# Patient Record
Sex: Male | Born: 1955 | Race: White | Hispanic: No | Marital: Single | Smoking: Former smoker
Health system: Southern US, Community
[De-identification: ages and names within clinical notes are randomized; demographics above are authoritative.]

## PROBLEM LIST (undated history)

## (undated) DIAGNOSIS — F17200 Nicotine dependence, unspecified, uncomplicated: Secondary | ICD-10-CM

## (undated) DIAGNOSIS — R0602 Shortness of breath: Secondary | ICD-10-CM

## (undated) DIAGNOSIS — K635 Polyp of colon: Secondary | ICD-10-CM

## (undated) DIAGNOSIS — I509 Heart failure, unspecified: Secondary | ICD-10-CM

## (undated) DIAGNOSIS — M1710 Unilateral primary osteoarthritis, unspecified knee: Secondary | ICD-10-CM

## (undated) DIAGNOSIS — J449 Chronic obstructive pulmonary disease, unspecified: Secondary | ICD-10-CM

## (undated) DIAGNOSIS — G4733 Obstructive sleep apnea (adult) (pediatric): Secondary | ICD-10-CM

## (undated) DIAGNOSIS — R600 Localized edema: Secondary | ICD-10-CM

## (undated) DIAGNOSIS — E669 Obesity, unspecified: Secondary | ICD-10-CM

## (undated) DIAGNOSIS — I10 Essential (primary) hypertension: Secondary | ICD-10-CM

## (undated) DIAGNOSIS — I272 Pulmonary hypertension, unspecified: Secondary | ICD-10-CM

## (undated) DIAGNOSIS — M179 Osteoarthritis of knee, unspecified: Secondary | ICD-10-CM

## (undated) DIAGNOSIS — R011 Cardiac murmur, unspecified: Secondary | ICD-10-CM

## (undated) DIAGNOSIS — E782 Mixed hyperlipidemia: Secondary | ICD-10-CM

## (undated) DIAGNOSIS — I349 Nonrheumatic mitral valve disorder, unspecified: Secondary | ICD-10-CM

## (undated) DIAGNOSIS — M1A9XX0 Chronic gout, unspecified, without tophus (tophi): Secondary | ICD-10-CM

## (undated) DIAGNOSIS — H409 Unspecified glaucoma: Secondary | ICD-10-CM

## (undated) HISTORY — DX: Chronic obstructive pulmonary disease, unspecified: J44.9

## (undated) HISTORY — PX: REPLACEMENT TOTAL KNEE: SUR1224

## (undated) HISTORY — PX: CERVICAL FUSION: SHX112

## (undated) SURGERY — LEFT HEART CATH AND CORONARY ANGIOGRAPHY
Anesthesia: Moderate Sedation

---

## 2006-06-01 ENCOUNTER — Ambulatory Visit: Payer: Self-pay | Admitting: Gastroenterology

## 2009-11-21 ENCOUNTER — Ambulatory Visit: Payer: Self-pay | Admitting: Family Medicine

## 2010-01-01 ENCOUNTER — Ambulatory Visit (HOSPITAL_COMMUNITY): Admission: RE | Admit: 2010-01-01 | Discharge: 2010-01-02 | Payer: Self-pay | Admitting: Neurological Surgery

## 2010-02-02 ENCOUNTER — Encounter: Admission: RE | Admit: 2010-02-02 | Discharge: 2010-02-02 | Payer: Self-pay | Admitting: Neurological Surgery

## 2010-06-11 LAB — CBC
Hemoglobin: 14.2 g/dL (ref 13.0–17.0)
RBC: 4.37 MIL/uL (ref 4.22–5.81)
WBC: 10.4 10*3/uL (ref 4.0–10.5)

## 2010-06-11 LAB — COMPREHENSIVE METABOLIC PANEL
ALT: 28 U/L (ref 0–53)
AST: 29 U/L (ref 0–37)
Alkaline Phosphatase: 67 U/L (ref 39–117)
CO2: 33 mEq/L — ABNORMAL HIGH (ref 19–32)
Calcium: 9.2 mg/dL (ref 8.4–10.5)
Chloride: 97 mEq/L (ref 96–112)
GFR calc Af Amer: >60 mL/min (ref >60)
GFR calc non Af Amer: >60 mL/min (ref >60)
Potassium: 3.8 mEq/L (ref 3.5–5.1)
Sodium: 136 mEq/L (ref 135–145)
Total Bilirubin: 0.6 mg/dL (ref 0.3–1.2)

## 2010-06-11 LAB — DIFFERENTIAL
Eosinophils Absolute: 0.2 10*3/uL (ref 0.0–0.7)
Eosinophils Relative: 1 % (ref 0–5)
Lymphs Abs: 2 10*3/uL (ref 0.7–4.0)

## 2010-06-11 LAB — SURGICAL PCR SCREEN: MRSA, PCR: NEGATIVE

## 2010-06-11 LAB — PROTIME-INR: Prothrombin Time: 12.3 seconds (ref 11.6–15.2)

## 2013-10-04 ENCOUNTER — Emergency Department: Payer: Self-pay | Admitting: Emergency Medicine

## 2014-12-28 ENCOUNTER — Encounter: Payer: Self-pay | Admitting: Emergency Medicine

## 2014-12-28 ENCOUNTER — Emergency Department: Payer: BLUE CROSS/BLUE SHIELD

## 2014-12-28 ENCOUNTER — Emergency Department
Admission: EM | Admit: 2014-12-28 | Discharge: 2014-12-28 | Disposition: A | Discharge status: Home / Self Care | Payer: BLUE CROSS/BLUE SHIELD | Attending: Emergency Medicine | Admitting: Emergency Medicine

## 2014-12-28 DIAGNOSIS — Z7982 Long term (current) use of aspirin: Secondary | ICD-10-CM | POA: Diagnosis not present

## 2014-12-28 DIAGNOSIS — Z79899 Other long term (current) drug therapy: Secondary | ICD-10-CM | POA: Insufficient documentation

## 2014-12-28 DIAGNOSIS — M545 Low back pain, unspecified: Secondary | ICD-10-CM

## 2014-12-28 DIAGNOSIS — R609 Edema, unspecified: Secondary | ICD-10-CM | POA: Diagnosis not present

## 2014-12-28 DIAGNOSIS — Z791 Long term (current) use of non-steroidal anti-inflammatories (NSAID): Secondary | ICD-10-CM | POA: Insufficient documentation

## 2014-12-28 DIAGNOSIS — I10 Essential (primary) hypertension: Secondary | ICD-10-CM | POA: Diagnosis not present

## 2014-12-28 HISTORY — DX: Polyp of colon: K63.5

## 2014-12-28 HISTORY — DX: Nonrheumatic mitral valve disorder, unspecified: I34.9

## 2014-12-28 HISTORY — DX: Mixed hyperlipidemia: E78.2

## 2014-12-28 HISTORY — DX: Essential (primary) hypertension: I10

## 2014-12-28 HISTORY — DX: Chronic gout, unspecified, without tophus (tophi): M1A.9XX0

## 2014-12-28 HISTORY — DX: Unilateral primary osteoarthritis, unspecified knee: M17.10

## 2014-12-28 HISTORY — DX: Osteoarthritis of knee, unspecified: M17.9

## 2014-12-28 LAB — COMPREHENSIVE METABOLIC PANEL
ALT: 22 U/L (ref 17–63)
AST: 26 U/L (ref 15–41)
Albumin: 3.5 g/dL (ref 3.5–5.0)
Alkaline Phosphatase: 76 U/L (ref 38–126)
Anion gap: 6 (ref 5–15)
BUN: 17 mg/dL (ref 6–20)
CHLORIDE: 103 mmol/L (ref 101–111)
CO2: 31 mmol/L (ref 22–32)
Calcium: 8.5 mg/dL — ABNORMAL LOW (ref 8.9–10.3)
Creatinine, Ser: 0.79 mg/dL (ref 0.61–1.24)
Glucose, Bld: 119 mg/dL — ABNORMAL HIGH (ref 65–99)
POTASSIUM: 4.1 mmol/L (ref 3.5–5.1)
Sodium: 140 mmol/L (ref 135–145)
Total Bilirubin: 0.6 mg/dL (ref 0.3–1.2)
Total Protein: 6.8 g/dL (ref 6.5–8.1)

## 2014-12-28 LAB — URINALYSIS COMPLETE WITH MICROSCOPIC (ARMC ONLY)
BILIRUBIN URINE: NEGATIVE
Bacteria, UA: NONE SEEN
GLUCOSE, UA: NEGATIVE mg/dL
HGB URINE DIPSTICK: NEGATIVE
Ketones, ur: NEGATIVE mg/dL
LEUKOCYTES UA: NEGATIVE
Nitrite: NEGATIVE
Protein, ur: NEGATIVE mg/dL
SPECIFIC GRAVITY, URINE: 1.02 (ref 1.005–1.030)
pH: 5 (ref 5.0–8.0)

## 2014-12-28 LAB — CBC
HCT: 43.2 % (ref 40.0–52.0)
Hemoglobin: 13.9 g/dL (ref 13.0–18.0)
MCH: 31.9 pg (ref 26.0–34.0)
MCHC: 32.2 g/dL (ref 32.0–36.0)
MCV: 99 fL (ref 80.0–100.0)
PLATELETS: 211 10*3/uL (ref 150–440)
RBC: 4.36 MIL/uL — ABNORMAL LOW (ref 4.40–5.90)
RDW: 14.1 % (ref 11.5–14.5)
WBC: 8.8 10*3/uL (ref 3.8–10.6)

## 2014-12-28 MED ORDER — MORPHINE SULFATE (PF) 4 MG/ML IV SOLN
4.0000 mg | Freq: Once | INTRAVENOUS | Status: AC
  Administered 2014-12-28: 4 mg via INTRAVENOUS

## 2014-12-28 MED ORDER — MORPHINE SULFATE (PF) 4 MG/ML IV SOLN: 4.0000 mg | Freq: Once | INTRAVENOUS | Status: DC

## 2014-12-28 MED ORDER — ONDANSETRON HCL 4 MG/2ML IJ SOLN: 4.0000 mg | Freq: Once | INTRAMUSCULAR | Status: DC

## 2014-12-28 MED ORDER — OXYCODONE-ACETAMINOPHEN 7.5-325 MG PO TABS: 1.0000 | ORAL_TABLET | ORAL | Status: DC | PRN

## 2014-12-28 MED ORDER — MORPHINE SULFATE (PF) 4 MG/ML IV SOLN
INTRAVENOUS | Status: AC
  Administered 2014-12-28: 4 mg via INTRAVENOUS
  Filled 2014-12-28: qty 1

## 2014-12-28 MED ORDER — FUROSEMIDE 20 MG PO TABS: 20.0000 mg | ORAL_TABLET | Freq: Every day | ORAL | Status: DC

## 2014-12-28 MED ORDER — IOHEXOL 350 MG/ML SOLN
100.0000 mL | Freq: Once | INTRAVENOUS | Status: AC | PRN
  Administered 2014-12-28: 100 mL via INTRAVENOUS

## 2014-12-28 MED ORDER — ONDANSETRON HCL 4 MG/2ML IJ SOLN
4.0000 mg | Freq: Once | INTRAMUSCULAR | Status: AC
  Administered 2014-12-28: 4 mg via INTRAVENOUS

## 2014-12-28 MED ORDER — ONDANSETRON HCL 4 MG/2ML IJ SOLN
INTRAMUSCULAR | Status: AC
  Administered 2014-12-28: 4 mg via INTRAVENOUS
  Filled 2014-12-28: qty 2

## 2014-12-28 MED ORDER — CYCLOBENZAPRINE HCL 10 MG PO TABS: 10.0000 mg | ORAL_TABLET | Freq: Three times a day (TID) | ORAL | Status: DC | PRN

## 2014-12-28 NOTE — ED Provider Notes (Signed)
  IMPRESSION: Areas of atherosclerotic change without aneurysm or dissection. No periaortic fluid. No hemodynamically significant obstruction noted in the major abdominal or pelvic vessels.  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  Left adrenal adenoma.  No renal or ureteral calculus. No hydronephrosis. There are a few prostatic calcifications.  Appendix appears normal. No bowel obstruction. No evidence of bowel ischemia.   Patient's pain is improved this point. He'll receive an additional dose of IV morphine and be discharged with pain medicine and muscle relaxant. I will also put him on a short supply of Lasix as he has lower extremity edema.  Emily Filbert, MD 12/28/14 [phone]

## 2014-12-28 NOTE — ED Provider Notes (Signed)
[facility] Emergency Department Provider Note  ____________________________________________  Time seen: Approximately 542 AM  I have reviewed the triage vital signs and the nursing notes.   HISTORY  Chief Complaint Back Pain    HPI Aaron Doyle is a 59 y.o. male who comes in today with bad lower back pain. The patient reports that the pain started the beginning of the week. The patient reports that the pain has been getting worse and worse during the week. He has no known injury and reports that he just woke up one morning hurting. The patient has been taking ibuprofen but went to his doctor this morning was given hydrocodone. He reports that he was told to take it easy and come back in a week to see if the pain was improving but he reports that the pain continued to worsen and he could not tolerate the pain anymore. The patient reports that the pain does not shoot down his leg but he feels as though it buckles whenever he walks. He reports that the pain is on the left side of his back and is 8 out of 10 in intensity. The patient's number had pain like this in the past and does not have a history of chronic back pain. The patient denies any abdominal pain any chest pain and shortness of breath blurred vision dizziness nausea or vomiting. He also denies any hematuria.   Past Medical History  Diagnosis Date  . Mixed hyperlipidemia   . Hypertension   . Nonrheumatic mitral valve disorder   . Osteoarthritis of knee   . Chronic gout   . Colon polyp     There are no active problems to display for this patient.   History reviewed. No pertinent past surgical history.  Current Outpatient Rx  Name  Route  Sig  Dispense  Refill  . allopurinol (ZYLOPRIM) 300 MG tablet   Oral   Take 300 mg by mouth at bedtime.         Marland Kitchen amLODipine (NORVASC) 10 MG tablet   Oral   Take 10 mg by mouth daily.         Marland Kitchen aspirin 81 MG tablet   Oral   Take 81 mg by mouth  daily.         Marland Kitchen atorvastatin (LIPITOR) 40 MG tablet   Oral   Take 40 mg by mouth daily.         . benazepril (LOTENSIN) 40 MG tablet   Oral   Take 40 mg by mouth daily.         . naproxen (NAPROSYN) 250 MG tablet   Oral   Take 250 mg by mouth 2 (two) times daily with a meal.         . Travoprost, BAK Free, (TRAVATAN) 0.004 % SOLN ophthalmic solution   Both Eyes   Place 1 drop into both eyes at bedtime.           Allergies Review of patient's allergies indicates no known allergies.  No family history on file.  Social History Social History  Substance Use Topics  . Smoking status: Unknown If Ever Smoked  . Smokeless tobacco: None  . Alcohol Use: No    Review of Systems Constitutional: No fever/chills Eyes: No visual changes. ENT: No sore throat. Cardiovascular: Denies chest pain. Respiratory: Denies shortness of breath. Gastrointestinal: No abdominal pain.  No nausea, no vomiting.   Genitourinary: Negative for dysuria. Musculoskeletal: back pain. Skin: Negative for rash.  Neurological: Negative for headaches, focal weakness or numbness.  10-point ROS otherwise negative.  ____________________________________________   PHYSICAL EXAM:  VITAL SIGNS: ED Triage Vitals  Enc Vitals Group     BP 12/28/14 0223 131/75 mmHg     Pulse Rate 12/28/14 0223 72     Resp 12/28/14 0223 20     Temp 12/28/14 0223 97.7 F (36.5 C)     Temp Source 12/28/14 0223 Oral     SpO2 12/28/14 0223 98 %     Weight 12/28/14 0223 256 lb (116.121 kg)     Height 12/28/14 0223 6' (1.829 m)     Head Cir --      Peak Flow --      Pain Score 12/28/14 0224 9     Pain Loc --      Pain Edu? --      Excl. in GC? --     Constitutional: Alert and oriented. Well appearing and in moderate distress. Eyes: Conjunctivae are normal. PERRL. EOMI. Head: Atraumatic. Nose: No congestion/rhinnorhea. Mouth/Throat: Mucous membranes are moist.  Oropharynx non-erythematous. Cardiovascular:  Normal rate, regular rhythm. Grossly normal heart sounds.  Good peripheral circulation. Respiratory: Normal respiratory effort.  No retractions. Lungs CTAB. Gastrointestinal: Soft and nontender. No distention.  Musculoskeletal: Bilateral lower extremity pitting edema. Left sided back tenderness to palpation with negative straight leg raise Neurologic:  Normal speech and language.  Skin:  Skin is warm, dry and intact.  Psychiatric: Mood and affect are normal.   ____________________________________________   LABS (all labs ordered are listed, but only abnormal results are displayed)  Labs Reviewed  URINALYSIS COMPLETEWITH MICROSCOPIC ([facility] ONLY) - Abnormal; Notable for the following:    Color, Urine YELLOW (*)    APPearance CLEAR (*)    Squamous Epithelial / LPF 0-5 (*)    All other components within normal limits  CBC - Abnormal; Notable for the following:    RBC 4.36 (*)    All other components within normal limits  COMPREHENSIVE METABOLIC PANEL - Abnormal; Notable for the following:    Glucose, Bld 119 (*)    Calcium 8.5 (*)    All other components within normal limits   ____________________________________________  EKG  None ____________________________________________  RADIOLOGY  CTA abdomen and pelvis ____________________________________________   PROCEDURES  Procedure(s) performed: None  Critical Care performed: No  ____________________________________________   INITIAL IMPRESSION / ASSESSMENT AND PLAN / ED COURSE  Pertinent labs & imaging results that were available during my care of the patient were reviewed by me and considered in my medical decision making (see chart for details).  This is a 59 year old male who comes in with left-sided back pain for the past week. The patient does not have a history of back pain and reports that the pain has been worsening and unbearable. The patient's blood work is unremarkable but given the patient's age and the new  onset of back pain I feel the patient needs to be evaluated for a AAA. The patient did receive a CT of his abdomen as well as some morphine and Zofran for pain and nausea.  Patient's care was signed out to Dr. Daryel November will follow-up the results of the patient's CT scan and determine the patient's disposition. ____________________________________________   FINAL CLINICAL IMPRESSION(S) / ED DIAGNOSES  Final diagnoses:  Left-sided low back pain without sciatica      Rebecka Apley, MD 12/28/14 0840

## 2014-12-28 NOTE — ED Notes (Signed)
Patient reports left lower back pain since Monday.  Patient reports pain progressively worse over the week.  Reports seen by his primary MD on Friday and given pain pills, but no relief.

## 2014-12-28 NOTE — Discharge Instructions (Signed)
Back Pain, Adult °Low back pain is very common. About 1 in 5 people have back pain. The cause of low back pain is rarely dangerous. The pain often gets better over time. About half of people with a sudden onset of back pain feel better in just 2 weeks. About 8 in 10 people feel better by 6 weeks.  °CAUSES °Some common causes of back pain include: °· Strain of the muscles or ligaments supporting the spine. °· Wear and tear (degeneration) of the spinal discs. °· Arthritis. °· Direct injury to the back. °DIAGNOSIS °Most of the time, the direct cause of low back pain is not known. However, back pain can be treated effectively even when the exact cause of the pain is unknown. Answering your caregiver's questions about your overall health and symptoms is one of the most accurate ways to make sure the cause of your pain is not dangerous. If your caregiver needs more information, he or she may order lab work or imaging tests (X-rays or MRIs). However, even if imaging tests show changes in your back, this usually does not require surgery. °HOME CARE INSTRUCTIONS °For many people, back pain returns. Since low back pain is rarely dangerous, it is often a condition that people can learn to manage on their own.  °· Remain active. It is stressful on the back to sit or stand in one place. Do not sit, drive, or stand in one place for more than 30 minutes at a time. Take short walks on level surfaces as soon as pain allows. Try to increase the length of time you walk each day. °· Do not stay in bed. Resting more than 1 or 2 days can delay your recovery. °· Do not avoid exercise or work. Your body is made to move. It is not dangerous to be active, even though your back may hurt. Your back will likely heal faster if you return to being active before your pain is gone. °· Pay attention to your body when you  bend and lift. Many people have less discomfort when lifting if they bend their knees, keep the load close to their bodies, and  avoid twisting. Often, the most comfortable positions are those that put less stress on your recovering back. °· Find a comfortable position to sleep. Use a firm mattress and lie on your side with your knees slightly bent. If you lie on your back, put a pillow under your knees. °· Only take over-the-counter or prescription medicines as directed by your caregiver. Over-the-counter medicines to reduce pain and inflammation are often the most helpful. Your caregiver may prescribe muscle relaxant drugs. These medicines help dull your pain so you can more quickly return to your normal activities and healthy exercise. °· Put ice on the injured area. °· Put ice in a plastic bag. °· Place a towel between your skin and the bag. °· Leave the ice on for 15-20 minutes, 03-04 times a day for the first 2 to 3 days. After that, ice and heat may be alternated to reduce pain and spasms. °· Ask your caregiver about trying back exercises and gentle massage. This may be of some benefit. °· Avoid feeling anxious or stressed. Stress increases muscle tension and can worsen back pain. It is important to recognize when you are anxious or stressed and learn ways to manage it. Exercise is a great option. °SEEK MEDICAL CARE IF: °· You have pain that is not relieved with rest or medicine. °· You have pain that does not improve in 1 week. °· You have new symptoms. °· You are generally not feeling well. °SEEK   IMMEDIATE MEDICAL CARE IF:  °· You have pain that radiates from your back into your legs. °· You develop new bowel or bladder control problems. °· You have unusual weakness or numbness in your arms or legs. °· You develop nausea or vomiting. °· You develop abdominal pain. °· You feel faint. °Document Released: 03/15/2005 Document Revised: 09/14/2011 Document Reviewed: 07/17/2013 °[employer]® Patient Information ©2015 [employer]. This information is not intended to replace advice given to you by your health care provider. Make sure you  discuss any questions you have with your health care provider. ° °Edema °Edema is an abnormal buildup of fluids in your body tissues. Edema is somewhat dependent on gravity to pull the fluid to the lowest place in your body. That makes the condition more common in the legs and thighs (lower extremities). Painless swelling of the feet and ankles is common and becomes more likely as you get older. It is also common in looser tissues, like around your eyes.  °When the affected area is squeezed, the fluid may move out of that spot and leave a dent for a few moments. This dent is called pitting.  °CAUSES  °There are many possible causes of edema. Eating too much salt and being on your feet or sitting for a long time can cause edema in your legs and ankles. Hot weather may make edema worse. Common medical causes of edema include: °· Heart failure. °· Liver disease. °· Kidney disease. °· Weak blood vessels in your legs. °· Cancer. °· An injury. °· Pregnancy. °· Some medications. °· Obesity.  °SYMPTOMS  °Edema is usually painless. Your skin may look swollen or shiny.  °DIAGNOSIS  °Your health care provider may be able to diagnose edema by asking about your medical history and doing a physical exam. You may need to have tests such as X-rays, an electrocardiogram, or blood tests to check for medical conditions that may cause edema.  °TREATMENT  °Edema treatment depends on the cause. If you have heart, liver, or kidney disease, you need the treatment appropriate for these conditions. General treatment may include: °· Elevation of the affected body part above the level of your heart. °· Compression of the affected body part. Pressure from elastic bandages or support stockings squeezes the tissues and forces fluid back into the blood vessels. This keeps fluid from entering the tissues. °· Restriction of fluid and salt intake. °· Use of a water pill (diuretic). These medications are appropriate only for some types of edema. They  pull fluid out of your body and make you urinate more often. This gets rid of fluid and reduces swelling, but diuretics can have side effects. Only use diuretics as directed by your health care provider. °HOME CARE INSTRUCTIONS  °· Keep the affected body part above the level of your heart when you are lying down.   °· Do not sit still or stand for prolonged periods.   °· Do not put anything directly under your knees when lying down. °· Do not wear constricting clothing or garters on your upper legs.   °· Exercise your legs to work the fluid back into your blood vessels. This may help the swelling go down.   °· Wear elastic bandages or support stockings to reduce ankle swelling as directed by your health care provider.   °· Eat a low-salt diet to reduce fluid if your health care provider recommends it.   °· Only take medicines as directed by your health care provider.  °SEEK MEDICAL CARE IF:  °· Your edema is not responding to   treatment. °· You have heart, liver, or kidney disease and notice symptoms of edema. °· You have edema in your legs that does not improve after elevating them.   °· You have sudden and unexplained weight gain. °SEEK IMMEDIATE MEDICAL CARE IF:  °· You develop shortness of breath or chest pain.   °· You cannot breathe when you lie down. °· You develop pain, redness, or warmth in the swollen areas.   °· You have heart, liver, or kidney disease and suddenly get edema. °· You have a fever and your symptoms suddenly get worse. °MAKE SURE YOU:  °· Understand these instructions. °· Will watch your condition. °· Will get help right away if you are not doing well or get worse. °Document Released: 03/15/2005 Document Revised: 07/30/2013 Document Reviewed: 01/05/2013 °[employer]® Patient Information ©2015 [employer]. This information is not intended to replace advice given to you by your health care provider. Make sure you discuss any questions you have with your health care provider. ° °

## 2015-06-19 ENCOUNTER — Other Ambulatory Visit: Payer: Self-pay | Admitting: Internal Medicine

## 2015-06-24 ENCOUNTER — Encounter: Payer: Self-pay | Admitting: *Deleted

## 2015-06-24 ENCOUNTER — Ambulatory Visit
Admission: RE | Admit: 2015-06-24 | Discharge: 2015-06-24 | Disposition: A | Discharge status: Home / Self Care | Payer: Medicaid Other | Source: Ambulatory Visit | Attending: Internal Medicine | Admitting: Internal Medicine

## 2015-06-24 ENCOUNTER — Other Ambulatory Visit: Payer: Self-pay

## 2015-06-24 ENCOUNTER — Encounter
Admission: RE | Disposition: A | Discharge status: Home / Self Care | Payer: Self-pay | Source: Ambulatory Visit | Attending: Internal Medicine

## 2015-06-24 DIAGNOSIS — I272 Other secondary pulmonary hypertension: Secondary | ICD-10-CM | POA: Diagnosis not present

## 2015-06-24 DIAGNOSIS — R0600 Dyspnea, unspecified: Secondary | ICD-10-CM | POA: Insufficient documentation

## 2015-06-24 DIAGNOSIS — R0602 Shortness of breath: Secondary | ICD-10-CM | POA: Diagnosis present

## 2015-06-24 DIAGNOSIS — I10 Essential (primary) hypertension: Secondary | ICD-10-CM | POA: Diagnosis not present

## 2015-06-24 DIAGNOSIS — Z8269 Family history of other diseases of the musculoskeletal system and connective tissue: Secondary | ICD-10-CM | POA: Diagnosis not present

## 2015-06-24 DIAGNOSIS — M1990 Unspecified osteoarthritis, unspecified site: Secondary | ICD-10-CM | POA: Insufficient documentation

## 2015-06-24 DIAGNOSIS — R609 Edema, unspecified: Secondary | ICD-10-CM | POA: Diagnosis not present

## 2015-06-24 DIAGNOSIS — Z8261 Family history of arthritis: Secondary | ICD-10-CM | POA: Diagnosis not present

## 2015-06-24 DIAGNOSIS — E669 Obesity, unspecified: Secondary | ICD-10-CM | POA: Insufficient documentation

## 2015-06-24 DIAGNOSIS — Z79899 Other long term (current) drug therapy: Secondary | ICD-10-CM | POA: Diagnosis not present

## 2015-06-24 DIAGNOSIS — Z7982 Long term (current) use of aspirin: Secondary | ICD-10-CM | POA: Insufficient documentation

## 2015-06-24 DIAGNOSIS — Z8249 Family history of ischemic heart disease and other diseases of the circulatory system: Secondary | ICD-10-CM | POA: Insufficient documentation

## 2015-06-24 DIAGNOSIS — H409 Unspecified glaucoma: Secondary | ICD-10-CM | POA: Diagnosis not present

## 2015-06-24 DIAGNOSIS — E7800 Pure hypercholesterolemia, unspecified: Secondary | ICD-10-CM | POA: Insufficient documentation

## 2015-06-24 DIAGNOSIS — Z6835 Body mass index (BMI) 35.0-35.9, adult: Secondary | ICD-10-CM | POA: Diagnosis not present

## 2015-06-24 HISTORY — PX: CARDIAC CATHETERIZATION: SHX172

## 2015-06-24 HISTORY — DX: Nicotine dependence, unspecified, uncomplicated: F17.200

## 2015-06-24 HISTORY — DX: Heart failure, unspecified: I50.9

## 2015-06-24 HISTORY — DX: Localized edema: R60.0

## 2015-06-24 HISTORY — DX: Obesity, unspecified: E66.9

## 2015-06-24 HISTORY — DX: Obstructive sleep apnea (adult) (pediatric): G47.33

## 2015-06-24 HISTORY — DX: Unspecified glaucoma: H40.9

## 2015-06-24 HISTORY — DX: Cardiac murmur, unspecified: R01.1

## 2015-06-24 HISTORY — DX: Pulmonary hypertension, unspecified: I27.20

## 2015-06-24 HISTORY — DX: Shortness of breath: R06.02

## 2015-06-24 SURGERY — RIGHT/LEFT HEART CATH AND CORONARY ANGIOGRAPHY
Anesthesia: Moderate Sedation

## 2015-06-24 MED ORDER — HEPARIN (PORCINE) IN NACL 2-0.9 UNIT/ML-% IJ SOLN
INTRAMUSCULAR | Status: AC
  Filled 2015-06-24: qty 500

## 2015-06-24 MED ORDER — FENTANYL CITRATE (PF) 100 MCG/2ML IJ SOLN
INTRAMUSCULAR | Status: AC
  Filled 2015-06-24: qty 2

## 2015-06-24 MED ORDER — SODIUM CHLORIDE 0.9 % WEIGHT BASED INFUSION: 3.0000 mL/kg/h | INTRAVENOUS | Status: DC

## 2015-06-24 MED ORDER — FENTANYL CITRATE (PF) 100 MCG/2ML IJ SOLN
INTRAMUSCULAR | Status: DC | PRN
  Administered 2015-06-24: 25 ug via INTRAVENOUS

## 2015-06-24 MED ORDER — MIDAZOLAM HCL 2 MG/2ML IJ SOLN
INTRAMUSCULAR | Status: DC | PRN
  Administered 2015-06-24: 1 mg via INTRAVENOUS

## 2015-06-24 MED ORDER — SODIUM CHLORIDE 0.9% FLUSH: 3.0000 mL | INTRAVENOUS | Status: DC | PRN

## 2015-06-24 MED ORDER — MIDAZOLAM HCL 2 MG/2ML IJ SOLN
INTRAMUSCULAR | Status: AC
  Filled 2015-06-24: qty 2

## 2015-06-24 MED ORDER — IOPAMIDOL (ISOVUE-300) INJECTION 61%
INTRAVENOUS | Status: DC | PRN
  Administered 2015-06-24: 90 mL via INTRA_ARTERIAL

## 2015-06-24 MED ORDER — SODIUM CHLORIDE 0.9 % WEIGHT BASED INFUSION
1.0000 mL/kg/h | INTRAVENOUS | Status: DC
  Administered 2015-06-24: 1 mL/kg/h via INTRAVENOUS

## 2015-06-24 SURGICAL SUPPLY — 13 items
CATH INFINITI 5FR ANG PIGTAIL (CATHETERS) ×3 IMPLANT
CATH INFINITI 5FR JL4 (CATHETERS) ×3 IMPLANT
CATH INFINITI JR4 5F (CATHETERS) ×3 IMPLANT
CATH SWANZ 7F THERMO (CATHETERS) ×3 IMPLANT
DEVICE CLOSURE MYNXGRIP 5F (Vascular Products) ×3 IMPLANT
GUIDEWIRE EMER 3M J .025X150CM (WIRE) ×3 IMPLANT
KIT MANI 3VAL PERCEP (MISCELLANEOUS) ×3 IMPLANT
KIT RIGHT HEART (MISCELLANEOUS) ×3 IMPLANT
NEEDLE PERC 18GX7CM (NEEDLE) ×3 IMPLANT
PACK CARDIAC CATH (CUSTOM PROCEDURE TRAY) ×3 IMPLANT
SHEATH AVANTI 5FR X 11CM (SHEATH) ×3 IMPLANT
SHEATH PINNACLE 7F 10CM (SHEATH) ×3 IMPLANT
WIRE EMERALD 3MM-J .035X150CM (WIRE) ×3 IMPLANT

## 2015-06-29 ENCOUNTER — Encounter: Payer: Self-pay | Admitting: Internal Medicine

## 2015-09-01 ENCOUNTER — Emergency Department: Payer: Medicaid Other

## 2015-09-01 ENCOUNTER — Inpatient Hospital Stay
Admission: EM | Admit: 2015-09-01 | Discharge: 2015-09-04 | DRG: 190 | Disposition: A | Discharge status: Home / Self Care | Payer: Medicaid Other | Attending: Internal Medicine | Admitting: Internal Medicine

## 2015-09-01 DIAGNOSIS — J441 Chronic obstructive pulmonary disease with (acute) exacerbation: Principal | ICD-10-CM | POA: Diagnosis present

## 2015-09-01 DIAGNOSIS — Z8249 Family history of ischemic heart disease and other diseases of the circulatory system: Secondary | ICD-10-CM

## 2015-09-01 DIAGNOSIS — I5033 Acute on chronic diastolic (congestive) heart failure: Secondary | ICD-10-CM | POA: Diagnosis present

## 2015-09-01 DIAGNOSIS — I272 Other secondary pulmonary hypertension: Secondary | ICD-10-CM | POA: Diagnosis present

## 2015-09-01 DIAGNOSIS — M179 Osteoarthritis of knee, unspecified: Secondary | ICD-10-CM | POA: Diagnosis present

## 2015-09-01 DIAGNOSIS — J449 Chronic obstructive pulmonary disease, unspecified: Secondary | ICD-10-CM | POA: Diagnosis present

## 2015-09-01 DIAGNOSIS — R0602 Shortness of breath: Secondary | ICD-10-CM

## 2015-09-01 DIAGNOSIS — G4733 Obstructive sleep apnea (adult) (pediatric): Secondary | ICD-10-CM | POA: Diagnosis present

## 2015-09-01 DIAGNOSIS — I10 Essential (primary) hypertension: Secondary | ICD-10-CM | POA: Diagnosis present

## 2015-09-01 DIAGNOSIS — H409 Unspecified glaucoma: Secondary | ICD-10-CM | POA: Diagnosis present

## 2015-09-01 DIAGNOSIS — Z79899 Other long term (current) drug therapy: Secondary | ICD-10-CM

## 2015-09-01 DIAGNOSIS — I5032 Chronic diastolic (congestive) heart failure: Secondary | ICD-10-CM | POA: Diagnosis not present

## 2015-09-01 DIAGNOSIS — Z7982 Long term (current) use of aspirin: Secondary | ICD-10-CM | POA: Diagnosis not present

## 2015-09-01 DIAGNOSIS — J9601 Acute respiratory failure with hypoxia: Secondary | ICD-10-CM | POA: Diagnosis present

## 2015-09-01 DIAGNOSIS — Z9119 Patient's noncompliance with other medical treatment and regimen: Secondary | ICD-10-CM

## 2015-09-01 DIAGNOSIS — Z6837 Body mass index (BMI) 37.0-37.9, adult: Secondary | ICD-10-CM

## 2015-09-01 DIAGNOSIS — F1010 Alcohol abuse, uncomplicated: Secondary | ICD-10-CM | POA: Diagnosis present

## 2015-09-01 DIAGNOSIS — F17210 Nicotine dependence, cigarettes, uncomplicated: Secondary | ICD-10-CM | POA: Diagnosis present

## 2015-09-01 DIAGNOSIS — M1A9XX0 Chronic gout, unspecified, without tophus (tophi): Secondary | ICD-10-CM | POA: Diagnosis present

## 2015-09-01 DIAGNOSIS — I110 Hypertensive heart disease with heart failure: Secondary | ICD-10-CM | POA: Diagnosis present

## 2015-09-01 DIAGNOSIS — E782 Mixed hyperlipidemia: Secondary | ICD-10-CM | POA: Diagnosis present

## 2015-09-01 LAB — CBC
HCT: 43 % (ref 40.0–52.0)
Hemoglobin: 14.6 g/dL (ref 13.0–18.0)
MCH: 33.2 pg (ref 26.0–34.0)
MCHC: 33.8 g/dL (ref 32.0–36.0)
MCV: 98.2 fL (ref 80.0–100.0)
PLATELETS: 238 10*3/uL (ref 150–440)
RBC: 4.38 MIL/uL — AB (ref 4.40–5.90)
RDW: 13.9 % (ref 11.5–14.5)
WBC: 10.1 10*3/uL (ref 3.8–10.6)

## 2015-09-01 LAB — BASIC METABOLIC PANEL
Anion gap: 8 (ref 5–15)
BUN: 19 mg/dL (ref 6–20)
CALCIUM: 9.1 mg/dL (ref 8.9–10.3)
CHLORIDE: 96 mmol/L — AB (ref 101–111)
CO2: 35 mmol/L — ABNORMAL HIGH (ref 22–32)
CREATININE: 0.98 mg/dL (ref 0.61–1.24)
GFR calc non Af Amer: >60 mL/min (ref >60)
Glucose, Bld: 110 mg/dL — ABNORMAL HIGH (ref 65–99)
Potassium: 3.7 mmol/L (ref 3.5–5.1)
SODIUM: 139 mmol/L (ref 135–145)

## 2015-09-01 LAB — TROPONIN I

## 2015-09-01 LAB — BRAIN NATRIURETIC PEPTIDE: B Natriuretic Peptide: 56 pg/mL (ref 0.0–100.0)

## 2015-09-01 MED ORDER — IPRATROPIUM-ALBUTEROL 0.5-2.5 (3) MG/3ML IN SOLN
3.0000 mL | Freq: Once | RESPIRATORY_TRACT | Status: AC
  Administered 2015-09-01: 3 mL via RESPIRATORY_TRACT
  Filled 2015-09-01: qty 3

## 2015-09-01 MED ORDER — ACETYLCYSTEINE 10 % IN SOLN
4.0000 mL | Freq: Once | RESPIRATORY_TRACT | Status: AC
  Administered 2015-09-01: 4 mL via RESPIRATORY_TRACT
  Filled 2015-09-01: qty 4

## 2015-09-01 MED ORDER — IPRATROPIUM-ALBUTEROL 0.5-2.5 (3) MG/3ML IN SOLN
RESPIRATORY_TRACT | Status: AC
  Administered 2015-09-01: 3 mL via RESPIRATORY_TRACT
  Filled 2015-09-01: qty 3

## 2015-09-01 MED ORDER — METHYLPREDNISOLONE SODIUM SUCC 125 MG IJ SOLR
125.0000 mg | Freq: Once | INTRAMUSCULAR | Status: AC
  Administered 2015-09-01: 125 mg via INTRAVENOUS
  Filled 2015-09-01: qty 2

## 2015-09-01 MED ORDER — IPRATROPIUM-ALBUTEROL 0.5-2.5 (3) MG/3ML IN SOLN
3.0000 mL | Freq: Once | RESPIRATORY_TRACT | Status: AC
  Administered 2015-09-01: 3 mL via RESPIRATORY_TRACT

## 2015-09-01 NOTE — H&P (Signed)
[facility]   PATIENT NAME: Aaron Doyle    MR#:  [id number]  DATE OF BIRTH:  30-Sep-1955  DATE OF ADMISSION:  09/01/2015  PRIMARY CARE PHYSICIAN: Abran Richard, PA-C   REQUESTING/REFERRING PHYSICIAN: Derrill Kay, MD  CHIEF COMPLAINT:   Chief Complaint  Patient presents with  . Shortness of Breath    HISTORY OF PRESENT ILLNESS:  Zackaria Burkey  is a 60 y.o. male who presents with Shortness of breath which has been progressive over the last several months. She does have a diagnosis of congestive heart failure and follows with cardiology here at [facility].  He went to see his primary care physician today and had an x-ray done there and was told that two thirds of his lungs were filled with fluid. He came here to the hospital for further evaluation. Chest x-ray here does not show any significant pulmonary edema or pleural effusions. His BNP in the ED was normal. Patient does not have a formal diagnosis of COPD but is a 40 year smoker. Patient does have a known history of pulmonary hypertension. Patient does state that he's had for the past couple of weeks increased sputum production. He denies any wheezing but states that he has developed cough. Hospitalists were called for admission and further evaluation  PAST MEDICAL HISTORY:   Past Medical History  Diagnosis Date  . Mixed hyperlipidemia   . Hypertension   . Nonrheumatic mitral valve disorder   . Osteoarthritis of knee   . Chronic gout   . Colon polyp   . Glaucoma   . Obesity   . Shortness of breath   . CHF (congestive heart failure) (HCC)   . Bilateral lower extremity edema   . Obstructive sleep apnea   . Heart murmur   . Smoking   . Mild pulmonary hypertension (HCC)     PAST SURGICAL HISTORY:   Past Surgical History  Procedure Laterality Date  . Cardiac catheterization N/A 06/24/2015    Procedure: Right/Left Heart Cath and Coronary Angiography;  Surgeon: Alwyn Pea, MD;  Location: [facility] INVASIVE CV LAB;  Service: Cardiovascular;  Laterality: N/A;  . Cervical fusion      SOCIAL HISTORY:   Social History  Substance Use Topics  . Smoking status: Current Every Day Smoker -- 0.25 packs/day for 40 years    Types: Cigarettes  . Smokeless tobacco: Not on file  . Alcohol Use: 1.8 oz/week    3 Shots of liquor per week    FAMILY HISTORY:   Family History  Problem Relation Age of Onset  . Arthritis    . Heart attack    . Multiple sclerosis      DRUG ALLERGIES:  No Known Allergies  MEDICATIONS AT HOME:   Prior to Admission medications   Medication Sig Start Date End Date Taking? Authorizing Provider  allopurinol (ZYLOPRIM) 300 MG tablet Take 300 mg by mouth at bedtime.    Historical Provider, MD  amLODipine (NORVASC) 10 MG tablet Take 10 mg by mouth daily. Reported on 06/24/2015    Historical Provider, MD  aspirin 81 MG tablet Take 81 mg by mouth daily.    Historical Provider, MD  atorvastatin (LIPITOR) 40 MG tablet Take 40 mg by mouth daily.    Historical Provider, MD  benazepril (LOTENSIN) 40 MG tablet Take 40 mg by mouth daily.    Historical Provider, MD  carvedilol (COREG) 6.25 MG tablet Take 6.25 mg by mouth 2 (two)  times daily with a meal.    Historical Provider, MD  cyclobenzaprine (FLEXERIL) 10 MG tablet Take 1 tablet (10 mg total) by mouth every 8 (eight) hours as needed for muscle spasms. Patient not taking: Reported on 06/24/2015 12/28/14 12/28/15  Emily FilbertJonathan E Williams, MD  furosemide (LASIX) 40 MG tablet Take 80 mg by mouth daily. 08/07/15   Historical Provider, MD  naproxen (NAPROSYN) 250 MG tablet Take 250 mg by mouth 2 (two) times daily with a meal. Reported on 06/24/2015    Historical Provider, MD  oxyCODONE-acetaminophen (PERCOCET) 7.5-325 MG tablet Take 1 tablet by mouth every 4 (four) hours as needed for severe pain. Patient not taking: Reported on 09/01/2015 12/28/14 12/28/15  Emily FilbertJonathan E Williams, MD  Travoprost, BAK Free,  (TRAVATAN) 0.004 % SOLN ophthalmic solution Place 1 drop into both eyes at bedtime.    Historical Provider, MD    REVIEW OF SYSTEMS:  Review of Systems  Constitutional: Negative for fever, chills, weight loss and malaise/fatigue.  HENT: Negative for ear pain, hearing loss and tinnitus.   Eyes: Negative for blurred vision, double vision, pain and redness.  Respiratory: Positive for cough, sputum production and shortness of breath. Negative for hemoptysis.   Cardiovascular: Negative for chest pain, palpitations, orthopnea and leg swelling.  Gastrointestinal: Negative for nausea, vomiting, abdominal pain, diarrhea and constipation.  Genitourinary: Negative for dysuria, frequency and hematuria.  Musculoskeletal: Negative for back pain, joint pain and neck pain.  Skin:       No acne, rash, or lesions  Neurological: Negative for dizziness, tremors, focal weakness and weakness.  Endo/Heme/Allergies: Negative for polydipsia. Does not bruise/bleed easily.  Psychiatric/Behavioral: Negative for depression. The patient is not nervous/anxious and does not have insomnia.      VITAL SIGNS:   Filed Vitals:   09/01/15 2215 09/01/15 2230 09/01/15 2245 09/01/15 2315  BP: 143/79 150/87 156/75 152/85  Pulse: 62 60 61 71  Temp:      TempSrc:      Resp: 23 21 24 30   Height:      Weight:      SpO2: 96% 97% 92% 99%   Wt Readings from Last 3 Encounters:  09/01/15 125.646 kg (277 lb)  06/24/15 118.842 kg (262 lb)  12/28/14 116.121 kg (256 lb)    PHYSICAL EXAMINATION:  Physical Exam  Vitals reviewed. Constitutional: He is oriented to person, place, and time. He appears well-developed and well-nourished. No distress.  HENT:  Head: Normocephalic and atraumatic.  Mouth/Throat: Oropharynx is clear and moist.  Eyes: Conjunctivae and EOM are normal. Pupils are equal, round, and reactive to light. No scleral icterus.  Neck: Normal range of motion. Neck supple. No JVD present. No thyromegaly present.   Cardiovascular: Normal rate, regular rhythm and intact distal pulses.  Exam reveals no gallop and no friction rub.   No murmur heard. Respiratory: He is in respiratory distress (mild). He has no wheezes. He has no rales.  Mild bilateral scattered coarse breath sounds  GI: Soft. Bowel sounds are normal. He exhibits no distension. There is no tenderness.  Musculoskeletal: Normal range of motion. He exhibits no edema.  No arthritis, no gout  Lymphadenopathy:    He has no cervical adenopathy.  Neurological: He is alert and oriented to person, place, and time. No cranial nerve deficit.  No dysarthria, no aphasia  Skin: Skin is warm and dry. No rash noted. No erythema.  Psychiatric: He has a normal mood and affect. His behavior is normal. Judgment and thought content  normal.    LABORATORY PANEL:   CBC  Recent Labs Lab 09/01/15 1705  WBC 10.1  HGB 14.6  HCT 43.0  PLT 238   ------------------------------------------------------------------------------------------------------------------  Chemistries   Recent Labs Lab 09/01/15 1705  NA 139  K 3.7  CL 96*  CO2 35*  GLUCOSE 110*  BUN 19  CREATININE 0.98  CALCIUM 9.1   ------------------------------------------------------------------------------------------------------------------  Cardiac Enzymes  Recent Labs Lab 09/01/15 1705  TROPONINI <0.03   ------------------------------------------------------------------------------------------------------------------  RADIOLOGY:  Dg Chest 2 View  09/01/2015  CLINICAL DATA:  60 year old male with shortness of breath EXAM: CHEST  2 VIEW COMPARISON:  Radiograph dated 12/26/2009 FINDINGS: There is shallow inspiration. Bibasilar atelectatic changes noted. Pneumonia is less likely. Clinical correlation is recommended. There is no pleural effusion or pneumothorax. Stable cardiac silhouette. There is degenerative changes of the shoulders and spine. Lower cervical fixation plate and  screws noted. No acute osseous pathology. IMPRESSION: Shallow inspiration with bibasilar atelectasis. Electronically Signed   By: Elgie Collard M.D.   On: 09/01/2015 21:21    EKG:   Orders placed or performed during the hospital encounter of 09/01/15  . ED EKG within 10 minutes  . ED EKG within 10 minutes  . EKG 12-Lead  . EKG 12-Lead    IMPRESSION AND PLAN:  Principal Problem:   COPD exacerbation (HCC) - IV steroids, duo nebs, azithromycin. Pulmonary consult for further recommendations Active Problems:   HTN (hypertension) - continue home meds   CHF (congestive heart failure) (HCC) - currently stable, not in exacerbation, continue home meds   OSA (obstructive sleep apnea) - patient does not currently have CPAP at home as he has not completed his diagnostic workup, we will order CPAP daily at bedtime here.  All the records are reviewed and case discussed with ED provider. Management plans discussed with the patient and/or family.  DVT PROPHYLAXIS: SubQ lovenox  GI PROPHYLAXIS: None  ADMISSION STATUS: Inpatient  CODE STATUS: Full Code Status History    This patient does not have a recorded code status. Please follow your organizational policy for patients in this situation.      TOTAL TIME TAKING CARE OF THIS PATIENT: 45 minutes.    Sakeenah Valcarcel FIELDING 09/01/2015, 11:54 PM  [facility]  Office  [phone]  CC: Primary care physician; Abran Richard, PA-C

## 2015-09-01 NOTE — ED Provider Notes (Signed)
[facility] Emergency Department Provider Note    ____________________________________________  Time seen: ~2050  I have reviewed the triage vital signs and the nursing notes.   HISTORY  Chief Complaint Shortness of Breath   History limited by: Not Limited   HPI Aaron Doyle is a 60 y.o. male who presents to the emergency department the request of his primary care doctor because of concerns for pulmonary edema. The patient states that he has been dealing with shortness of breath for months. It has gotten worse the past 2 weeks. He has had associated swelling in the bilateral lower extremities. He is additionally had a cough productive of dark mucus. When he went to his primary care doctor's office today a chest x-ray was performed. He says the doctor told him that to two thirds of his lungs were fluid. The doctor did not think oral diuresis was going to be effective. Patient denies any recent fevers.   Past Medical History  Diagnosis Date  . Mixed hyperlipidemia   . Hypertension   . Nonrheumatic mitral valve disorder   . Osteoarthritis of knee   . Chronic gout   . Colon polyp   . Glaucoma   . Obesity   . Shortness of breath   . CHF (congestive heart failure) (HCC)   . Bilateral lower extremity edema   . Obstructive sleep apnea   . Heart murmur   . Smoking   . Mild pulmonary hypertension (HCC)     There are no active problems to display for this patient.   Past Surgical History  Procedure Laterality Date  . Cardiac catheterization N/A 06/24/2015    Procedure: Right/Left Heart Cath and Coronary Angiography;  Surgeon: Alwyn Pea, MD;  Location: [facility] INVASIVE CV LAB;  Service: Cardiovascular;  Laterality: N/A;  . Cervical fusion      Current Outpatient Rx  Name  Route  Sig  Dispense  Refill  . allopurinol (ZYLOPRIM) 300 MG tablet   Oral   Take 300 mg by mouth at bedtime.         Marland Kitchen amLODipine (NORVASC) 10 MG tablet   Oral   Take  10 mg by mouth daily. Reported on 06/24/2015         . aspirin 81 MG tablet   Oral   Take 81 mg by mouth daily.         Marland Kitchen atorvastatin (LIPITOR) 40 MG tablet   Oral   Take 40 mg by mouth daily.         . benazepril (LOTENSIN) 40 MG tablet   Oral   Take 40 mg by mouth daily.         . carvedilol (COREG) 6.25 MG tablet   Oral   Take 6.25 mg by mouth 2 (two) times daily with a meal.         . cyclobenzaprine (FLEXERIL) 10 MG tablet   Oral   Take 1 tablet (10 mg total) by mouth every 8 (eight) hours as needed for muscle spasms. Patient not taking: Reported on 06/24/2015   30 tablet   1   . furosemide (LASIX) 20 MG tablet   Oral   Take 1 tablet (20 mg total) by mouth daily.   7 tablet   0   . naproxen (NAPROSYN) 250 MG tablet   Oral   Take 250 mg by mouth 2 (two) times daily with a meal. Reported on 06/24/2015         .  oxyCODONE-acetaminophen (PERCOCET) 7.5-325 MG tablet   Oral   Take 1 tablet by mouth every 4 (four) hours as needed for severe pain.   20 tablet   0   . Travoprost, BAK Free, (TRAVATAN) 0.004 % SOLN ophthalmic solution   Both Eyes   Place 1 drop into both eyes at bedtime.           Allergies Review of patient's allergies indicates no known allergies.  No family history on file.  Social History Social History  Substance Use Topics  . Smoking status: Current Every Day Smoker -- 0.25 packs/day for 40 years    Types: Cigarettes  . Smokeless tobacco: None  . Alcohol Use: 1.8 oz/week    3 Shots of liquor per week    Review of Systems  Constitutional: Negative for fever. Cardiovascular: Negative for chest pain. Respiratory: Negative for shortness of breath. Gastrointestinal: Negative for abdominal pain, vomiting and diarrhea. Neurological: Negative for headaches, focal weakness or numbness.   10-point ROS otherwise negative.  ____________________________________________   PHYSICAL EXAM:  VITAL SIGNS: ED Triage Vitals  Enc  Vitals Group     BP 09/01/15 1658 155/81 mmHg     Pulse Rate 09/01/15 1658 66     Resp 09/01/15 1658 20     Temp 09/01/15 1658 97.8 F (36.6 C)     Temp Source 09/01/15 1658 Oral     SpO2 09/01/15 1658 94 %     Weight 09/01/15 1658 277 lb (125.646 kg)     Height 09/01/15 1658 6' (1.829 m)   Constitutional: Alert and oriented. Well appearing and in no distress. Eyes: Conjunctivae are normal. PERRL. Normal extraocular movements. ENT   Head: Normocephalic and atraumatic.   Nose: No congestion/rhinnorhea.   Mouth/Throat: Mucous membranes are moist.   Neck: No stridor. Hematological/Lymphatic/Immunilogical: No cervical lymphadenopathy. Cardiovascular: Normal rate, regular rhythm.  No murmurs, rubs, or gallops. Respiratory: Normal respiratory effort without tachypnea nor retractions. Diminished breath sounds diffusely. No obvious wheezing or crackles.  Gastrointestinal: Soft and nontender. No distention. There is no CVA tenderness. Genitourinary: Deferred Musculoskeletal: Normal range of motion in all extremities. No joint effusions.  Bilateral 2+ edema.  Neurologic:  Normal speech and language. No gross focal neurologic deficits are appreciated.  Skin:  Skin is warm, dry and intact. No rash noted. Psychiatric: Mood and affect are normal. Speech and behavior are normal. Patient exhibits appropriate insight and judgment.  ____________________________________________    LABS (pertinent positives/negatives)  Labs Reviewed  BASIC METABOLIC PANEL - Abnormal; Notable for the following:    Chloride 96 (*)    CO2 35 (*)    Glucose, Bld 110 (*)    All other components within normal limits  CBC - Abnormal; Notable for the following:    RBC 4.38 (*)    All other components within normal limits  TROPONIN I  BRAIN NATRIURETIC PEPTIDE     ____________________________________________   EKG  I, Phineas Semen, attending physician, personally viewed and interpreted this  EKG  EKG Time: 1702 Rate: 62 Rhythm: normal sinus rhythm with 1st degree av block Axis: normal Intervals: qtc 438 QRS: RBBB ST changes: no st elevation Impression: abnormal ekg  ____________________________________________    RADIOLOGY  CXR IMPRESSION: Shallow inspiration with bibasilar atelectasis.  ____________________________________________   PROCEDURES  Procedure(s) performed: None  Critical Care performed: No  ____________________________________________   INITIAL IMPRESSION / ASSESSMENT AND PLAN / ED COURSE  Pertinent labs & imaging results that were available during my care of the  patient were reviewed by me and considered in my medical decision making (see chart for details).  Patient presented today because of concerns for shortness breath and possible CHF exacerbation. On exam patient had extremely diminished breath sounds diffusely. No obvious wheezes or crackles. Did have bilateral edema. Chest x-ray however without any obvious pulmonary edema. BNP within normal limits. This point think likely could be worsening pulmonary fibrosis. Ambulatory O2 sats in the 80s. Will plan on admission.  ____________________________________________   FINAL CLINICAL IMPRESSION(S) / ED DIAGNOSES  Final diagnoses:  Shortness of breath     Note: This dictation was prepared with Dragon dictation. Any transcriptional errors that result from this process are unintentional    Phineas SemenGraydon Leyli Kevorkian, MD 09/01/15 2329

## 2015-09-01 NOTE — ED Notes (Signed)
Ambulated patient around nurse's station. Patient dropped to mid 80's while ambulating. Lowest at 84% on room air.

## 2015-09-01 NOTE — ED Notes (Signed)
Pt c/o SOB for the past week, states had xray and PCP and was told his CHF is flared up and referred to the ED..Marland Kitchen

## 2015-09-02 DIAGNOSIS — J441 Chronic obstructive pulmonary disease with (acute) exacerbation: Principal | ICD-10-CM

## 2015-09-02 LAB — BASIC METABOLIC PANEL
Anion gap: 10 (ref 5–15)
BUN: 16 mg/dL (ref 6–20)
CO2: 33 mmol/L — ABNORMAL HIGH (ref 22–32)
CREATININE: 0.89 mg/dL (ref 0.61–1.24)
Calcium: 8.8 mg/dL — ABNORMAL LOW (ref 8.9–10.3)
Chloride: 97 mmol/L — ABNORMAL LOW (ref 101–111)
GFR calc Af Amer: >60 mL/min (ref >60)
Glucose, Bld: 194 mg/dL — ABNORMAL HIGH (ref 65–99)
Potassium: 4 mmol/L (ref 3.5–5.1)
SODIUM: 140 mmol/L (ref 135–145)

## 2015-09-02 LAB — GLUCOSE, CAPILLARY
GLUCOSE-CAPILLARY: 198 mg/dL — AB (ref 65–99)
Glucose-Capillary: 211 mg/dL — ABNORMAL HIGH (ref 65–99)

## 2015-09-02 LAB — CBC
HCT: 44.3 % (ref 40.0–52.0)
Hemoglobin: 14.5 g/dL (ref 13.0–18.0)
MCH: 32.9 pg (ref 26.0–34.0)
MCHC: 32.8 g/dL (ref 32.0–36.0)
MCV: 100.5 fL — AB (ref 80.0–100.0)
PLATELETS: 211 10*3/uL (ref 150–440)
RBC: 4.41 MIL/uL (ref 4.40–5.90)
RDW: 13.9 % (ref 11.5–14.5)
WBC: 9.6 10*3/uL (ref 3.8–10.6)

## 2015-09-02 MED ORDER — ACETAMINOPHEN 325 MG PO TABS: 650.0000 mg | ORAL_TABLET | Freq: Four times a day (QID) | ORAL | Status: DC | PRN

## 2015-09-02 MED ORDER — FUROSEMIDE 10 MG/ML IJ SOLN: 80.0000 mg | Freq: Every day | INTRAMUSCULAR | Status: DC

## 2015-09-02 MED ORDER — BENAZEPRIL HCL 20 MG PO TABS
40.0000 mg | ORAL_TABLET | Freq: Every day | ORAL | Status: DC
  Administered 2015-09-02 – 2015-09-04 (×3): 40 mg via ORAL
  Filled 2015-09-02 (×3): qty 2

## 2015-09-02 MED ORDER — IPRATROPIUM-ALBUTEROL 0.5-2.5 (3) MG/3ML IN SOLN: 3.0000 mL | RESPIRATORY_TRACT | Status: DC | PRN

## 2015-09-02 MED ORDER — AMLODIPINE BESYLATE 10 MG PO TABS
10.0000 mg | ORAL_TABLET | Freq: Every day | ORAL | Status: DC
  Administered 2015-09-02 – 2015-09-04 (×3): 10 mg via ORAL
  Filled 2015-09-02 (×3): qty 1

## 2015-09-02 MED ORDER — FUROSEMIDE 40 MG PO TABS
80.0000 mg | ORAL_TABLET | Freq: Every day | ORAL | Status: DC
  Administered 2015-09-02: 80 mg via ORAL
  Filled 2015-09-02: qty 2

## 2015-09-02 MED ORDER — METHYLPREDNISOLONE SODIUM SUCC 125 MG IJ SOLR
60.0000 mg | Freq: Four times a day (QID) | INTRAMUSCULAR | Status: DC
  Administered 2015-09-02 (×2): 60 mg via INTRAVENOUS
  Filled 2015-09-02 (×2): qty 2

## 2015-09-02 MED ORDER — ATORVASTATIN CALCIUM 20 MG PO TABS
40.0000 mg | ORAL_TABLET | Freq: Every day | ORAL | Status: DC
  Administered 2015-09-02 – 2015-09-04 (×3): 40 mg via ORAL
  Filled 2015-09-02 (×3): qty 2

## 2015-09-02 MED ORDER — ONDANSETRON HCL 4 MG/2ML IJ SOLN: 4.0000 mg | Freq: Four times a day (QID) | INTRAMUSCULAR | Status: DC | PRN

## 2015-09-02 MED ORDER — METHYLPREDNISOLONE SODIUM SUCC 125 MG IJ SOLR
60.0000 mg | INTRAMUSCULAR | Status: DC
  Administered 2015-09-03: 60 mg via INTRAVENOUS
  Filled 2015-09-02: qty 2

## 2015-09-02 MED ORDER — AZITHROMYCIN 500 MG PO TABS
500.0000 mg | ORAL_TABLET | Freq: Every day | ORAL | Status: DC
  Administered 2015-09-02: 500 mg via ORAL
  Filled 2015-09-02: qty 1

## 2015-09-02 MED ORDER — CARVEDILOL 6.25 MG PO TABS
6.2500 mg | ORAL_TABLET | Freq: Two times a day (BID) | ORAL | Status: DC
  Administered 2015-09-02 – 2015-09-04 (×5): 6.25 mg via ORAL
  Filled 2015-09-02 (×5): qty 1

## 2015-09-02 MED ORDER — IPRATROPIUM-ALBUTEROL 0.5-2.5 (3) MG/3ML IN SOLN
3.0000 mL | RESPIRATORY_TRACT | Status: DC
  Administered 2015-09-02 – 2015-09-03 (×7): 3 mL via RESPIRATORY_TRACT
  Filled 2015-09-02 (×7): qty 3

## 2015-09-02 MED ORDER — TIOTROPIUM BROMIDE MONOHYDRATE 18 MCG IN CAPS
18.0000 ug | ORAL_CAPSULE | Freq: Every day | RESPIRATORY_TRACT | Status: DC
  Administered 2015-09-03 – 2015-09-04 (×2): 18 ug via RESPIRATORY_TRACT
  Filled 2015-09-02: qty 5

## 2015-09-02 MED ORDER — ENOXAPARIN SODIUM 40 MG/0.4ML ~~LOC~~ SOLN
40.0000 mg | SUBCUTANEOUS | Status: DC
  Administered 2015-09-02 – 2015-09-04 (×3): 40 mg via SUBCUTANEOUS
  Filled 2015-09-02 (×3): qty 0.4

## 2015-09-02 MED ORDER — LEVOFLOXACIN 750 MG PO TABS
750.0000 mg | ORAL_TABLET | Freq: Every day | ORAL | Status: DC
Start: 2015-09-02 — End: 2015-09-03
  Administered 2015-09-02: 750 mg via ORAL
  Filled 2015-09-02: qty 1

## 2015-09-02 MED ORDER — OXYCODONE-ACETAMINOPHEN 7.5-325 MG PO TABS: 1.0000 | ORAL_TABLET | ORAL | Status: DC | PRN

## 2015-09-02 MED ORDER — BUDESONIDE 0.5 MG/2ML IN SUSP
0.5000 mg | Freq: Two times a day (BID) | RESPIRATORY_TRACT | Status: DC
  Administered 2015-09-02 – 2015-09-04 (×4): 0.5 mg via RESPIRATORY_TRACT
  Filled 2015-09-02 (×4): qty 2

## 2015-09-02 MED ORDER — FUROSEMIDE 10 MG/ML IJ SOLN
80.0000 mg | Freq: Every day | INTRAMUSCULAR | Status: DC
  Administered 2015-09-03 – 2015-09-04 (×2): 80 mg via INTRAVENOUS
  Filled 2015-09-02 (×2): qty 8

## 2015-09-02 MED ORDER — ACETAMINOPHEN 650 MG RE SUPP: 650.0000 mg | Freq: Four times a day (QID) | RECTAL | Status: DC | PRN

## 2015-09-02 MED ORDER — SODIUM CHLORIDE 0.9% FLUSH
3.0000 mL | Freq: Two times a day (BID) | INTRAVENOUS | Status: DC
  Administered 2015-09-02 – 2015-09-04 (×6): 3 mL via INTRAVENOUS

## 2015-09-02 MED ORDER — MOMETASONE FURO-FORMOTEROL FUM 100-5 MCG/ACT IN AERO
2.0000 | INHALATION_SPRAY | Freq: Two times a day (BID) | RESPIRATORY_TRACT | Status: DC
  Administered 2015-09-02 – 2015-09-04 (×4): 2 via RESPIRATORY_TRACT
  Filled 2015-09-02: qty 8.8

## 2015-09-02 MED ORDER — ONDANSETRON HCL 4 MG PO TABS: 4.0000 mg | ORAL_TABLET | Freq: Four times a day (QID) | ORAL | Status: DC | PRN

## 2015-09-02 MED ORDER — ASPIRIN EC 81 MG PO TBEC
81.0000 mg | DELAYED_RELEASE_TABLET | Freq: Every day | ORAL | Status: DC
  Administered 2015-09-02 – 2015-09-04 (×3): 81 mg via ORAL
  Filled 2015-09-02 (×3): qty 1

## 2015-09-02 NOTE — Plan of Care (Signed)
Problem: Safety: Goal: Ability to remain free from injury will improve Outcome: Progressing Remain free from falls this shift.  Problem: Pain Managment: Goal: General experience of comfort will improve Outcome: Completed/Met Date Met:  09/02/15 No complaints of pain the shift.  Problem: Nutrition: Goal: Adequate nutrition will be maintained Outcome: Completed/Met Date Met:  09/02/15 Eating and drinking without difficulty.  Problem: Phase I Progression Outcomes Goal: O2 sats > or equal 90% or at baseline Outcome: Progressing Pt still remaining on 2L of oxygen  Goal: Dyspnea controlled at rest Outcome: Progressing Still with some Shortness of breath on exertion. Goal: Flu/PneumoVaccines if indicated Outcome: Not Progressing Pt refusing pneumonia vaccination. Goal: Pain controlled Outcome: Progressing No complaints of pain this shift. Goal: Progress activity as tolerated unless otherwise ordered Outcome: Progressing Pt ambulated in room Goal: Tolerating diet Outcome: Completed/Met Date Met:  09/02/15 Eating and drinking without difficulty.

## 2015-09-02 NOTE — ED Notes (Signed)
Pt transported to room 154 

## 2015-09-02 NOTE — Care Management Note (Addendum)
Case Management Note  Patient Details  Name: SLOANE JUNKIN MRN: [id number] Date of Birth: [birth year]  Subjective/Objective:                  Met with patient to discuss discharge planning. O2 is acute. He is from [facility]. His PCP is with [facility] He has a nebulizer but needs refill of neb medications. He is independent with mobility. He has a cane available. He drives. He denies trouble obtaining medications.   Action/Plan: RNCM to follow for home O2- referral to [facility] if needed.    Expected Discharge Date:  09/04/15               Expected Discharge Plan:     In-House Referral:     Discharge planning Services  CM Consult  Post Acute Care Choice:  Durable Medical Equipment Choice offered to:  Patient  DME Arranged:  Oxygen DME Agency:  [place]:    [facility] Agency:     Status of Service:  In process, will continue to follow  Medicare Important Message Given:    Date Medicare IM Given:    Medicare IM give by:    Date Additional Medicare IM Given:    Additional Medicare Important Message give by:     If discussed at [place] of Stay Meetings, dates discussed:    Additional Comments:  Marshell Garfinkel, RN 09/02/2015, 10:04 AM

## 2015-09-02 NOTE — Progress Notes (Signed)
[facility] - [place] at [facility]   PATIENT NAME: Haider Hornaday    MRN#:  [id number]  DATE OF BIRTH:  08-30-1955  SUBJECTIVE:  Hospital Day: 1 day Byrne Capek is a 60 y.o. male presenting with Shortness of Breath .   Overnight events: No overnight events Interval Events: Still having cough nonproductive  REVIEW OF SYSTEMS:  CONSTITUTIONAL: No fever, fatigue or weakness.  EYES: No blurred or double vision.  EARS, NOSE, AND THROAT: No tinnitus or ear pain.  RESPIRATORY: Positive cough, shortness of breath, denies wheezing or hemoptysis.  CARDIOVASCULAR: No chest pain, orthopnea, positive edema.  GASTROINTESTINAL: No nausea, vomiting, diarrhea or abdominal pain.  GENITOURINARY: No dysuria, hematuria.  ENDOCRINE: No polyuria, nocturia,  HEMATOLOGY: No anemia, easy bruising or bleeding SKIN: No rash or lesion. MUSCULOSKELETAL: No joint pain or arthritis.   NEUROLOGIC: No tingling, numbness, weakness.  PSYCHIATRY: No anxiety or depression.   DRUG ALLERGIES:  No Known Allergies  VITALS:  Blood pressure 161/74, pulse 86, temperature 98.2 F (36.8 C), temperature source Axillary, resp. rate 19, height 6' (1.829 m), weight 167 lb 14.4 oz (76.159 kg), SpO2 94 %.  PHYSICAL EXAMINATION:  VITAL SIGNS: Filed Vitals:   09/02/15 0107 09/02/15 0447  BP: 161/77 161/74  Pulse: 64 86  Temp: 98 F (36.7 C) 98.2 F (36.8 C)  Resp: 18 19   GENERAL:59 y.o.male currently in no acute distress.  HEAD: Normocephalic, atraumatic.  EYES: Pupils equal, round, reactive to light. Extraocular muscles intact. No scleral icterus.  MOUTH: Moist mucosal membrane. Dentition intact. No abscess noted.  EAR, NOSE, THROAT: Clear without exudates. No external lesions.  NECK: Supple. No thyromegaly. No nodules. No JVD.  PULMONARY: Diminished breath sounds with basilar rhonchi without wheeze No use of accessory muscles, Good respiratory effort. good air entry bilaterally CHEST:  Nontender to palpation.  CARDIOVASCULAR: S1 and S2. Regular rate and rhythm. No murmurs, rubs, or gallops. 2+ edema. Pedal pulses 2+ bilaterally.  GASTROINTESTINAL: Soft, nontender, nondistended. No masses. Positive bowel sounds. No hepatosplenomegaly.  MUSCULOSKELETAL: No swelling, clubbing, or edema. Range of motion full in all extremities.  NEUROLOGIC: Cranial nerves II through XII are intact. No gross focal neurological deficits. Sensation intact. Reflexes intact.  SKIN: No ulceration, lesions, rashes, or cyanosis. Skin warm and dry. Turgor intact.  PSYCHIATRIC: Mood, affect within normal limits. The patient is awake, alert and oriented x 3. Insight, judgment intact.      LABORATORY PANEL:   CBC  Recent Labs Lab 09/02/15 0432  WBC 9.6  HGB 14.5  HCT 44.3  PLT 211   ------------------------------------------------------------------------------------------------------------------  Chemistries   Recent Labs Lab 09/02/15 0432  NA 140  K 4.0  CL 97*  CO2 33*  GLUCOSE 194*  BUN 16  CREATININE 0.89  CALCIUM 8.8*   ------------------------------------------------------------------------------------------------------------------  Cardiac Enzymes  Recent Labs Lab 09/01/15 1705  TROPONINI <0.03   ------------------------------------------------------------------------------------------------------------------  RADIOLOGY:  Dg Chest 2 View  09/01/2015  CLINICAL DATA:  60 year old male with shortness of breath EXAM: CHEST  2 VIEW COMPARISON:  Radiograph dated 12/26/2009 FINDINGS: There is shallow inspiration. Bibasilar atelectatic changes noted. Pneumonia is less likely. Clinical correlation is recommended. There is no pleural effusion or pneumothorax. Stable cardiac silhouette. There is degenerative changes of the shoulders and spine. Lower cervical fixation plate and screws noted. No acute osseous pathology. IMPRESSION: Shallow inspiration with bibasilar atelectasis.  Electronically Signed   By: Elgie Collard M.D.   On: 09/01/2015 21:21    EKG:  Orders placed or performed during the hospital encounter of 09/01/15  . ED EKG within 10 minutes  . ED EKG within 10 minutes  . EKG 12-Lead  . EKG 12-Lead    ASSESSMENT AND PLAN:   Janora NorlanderClyde Miggins is a 60 y.o. male presenting with Shortness of Breath . Admitted 09/01/2015 : Day #: 1 day 1. Acute hypoxic respiratory failure: COPD exacerbation, continue supplemental oxygen and scheduled breathing treatments steroids 2. Diastolic congestive heart failure acute on chronic: States lower extremity edema at baseline will use IV diuresis and continue to follow and no evidence of pulmonary edema on chest x-ray findings, continue Coreg and benazepril 3. Hyperlipidemia unspecified Lipitor    All the records are reviewed and case discussed with Care Management/Social Workerr. Management plans discussed with the patient, family and they are in agreement.  CODE STATUS: full TOTAL TIME TAKING CARE OF THIS PATIENT: 28 minutes.   POSSIBLE D/C IN 1-2DAYS, DEPENDING ON CLINICAL CONDITION.   Hower,  Mardi MainlandDavid K M.D on 09/02/2015 at 1:07 PM  Between 7am to 6pm - Pager - [phone]  After 6pm: House Pager: - [phone]  [facility]  Office  [phone]  CC: Primary care physician; Abran RichardBAUCOM, JENNY B, PA-C

## 2015-09-02 NOTE — Consult Note (Signed)
Name: Aaron Doyle MRN: [id number] DOB: September 24, 1955    ADMISSION DATE:  09/01/2015 CONSULTATION DATE:  09/02/15  REFERRING MD :  Clint Guy, D  CHIEF COMPLAINT:  SOB  BRIEF PATIENT DESCRIPTION: 60 year old male with history of hypertension, hyperlipidemia, obesity, congestive heart failure presented with increased shortness of breath possibly related to CHF/COPD exacerbation  SIGNIFICANT EVENTS  None  STUDIES:  None   HISTORY OF PRESENT ILLNESS:  Aaron Doyle, Aaron Doyle is a 60 year old male with past medical history significant for hypertension, hyperlipidemia, nonrheumatic mitral valve disorder, gout, tobacco abuse, COPD mild pulmonary hypertension. Patient presented to the ED on 6/5 with shortness of breath which has been progressive over the last several months. Patient states that he is followed by cardiologist at [facility]. Patient states that for the past couple of weeks has increased production of sputum which is light yellow in color. Patient denies any wheezing, chest pain, nausea vomiting at this time. PAST MEDICAL HISTORY :   has a past medical history of Mixed hyperlipidemia; Hypertension; Nonrheumatic mitral valve disorder; Osteoarthritis of knee; Chronic gout; Colon polyp; Glaucoma; Obesity; Shortness of breath; CHF (congestive heart failure) (HCC); Bilateral lower extremity edema; Obstructive sleep apnea; Heart murmur; Smoking; and Mild pulmonary hypertension (HCC).  has past surgical history that includes Cardiac catheterization (N/A, 06/24/2015) and Cervical fusion. Prior to Admission medications   Medication Sig Start Date End Date Taking? Authorizing Provider  allopurinol (ZYLOPRIM) 300 MG tablet Take 300 mg by mouth at bedtime.   Yes Historical Provider, MD  aspirin 81 MG tablet Take 81 mg by mouth daily.   Yes Historical Provider, MD  atorvastatin (LIPITOR) 40 MG tablet Take 40 mg by mouth daily.   Yes Historical Provider, MD  benazepril (LOTENSIN) 40 MG tablet Take 40 mg by  mouth daily.   Yes Historical Provider, MD  carvedilol (COREG) 6.25 MG tablet Take 6.25 mg by mouth 2 (two) times daily with a meal.   Yes Historical Provider, MD  furosemide (LASIX) 40 MG tablet Take 40 mg by mouth 2 (two) times daily.  08/07/15  Yes Historical Provider, MD  naproxen (NAPROSYN) 250 MG tablet Take 250 mg by mouth 2 (two) times daily with a meal. Reported on 06/24/2015   Yes Historical Provider, MD  Travoprost, BAK Free, (TRAVATAN) 0.004 % SOLN ophthalmic solution Place 1 drop into both eyes at bedtime.   Yes Historical Provider, MD   No Known Allergies  FAMILY HISTORY:  family history is not on file. SOCIAL HISTORY:  reports that he has been smoking Cigarettes.  He has a 10 pack-year smoking history. He does not have any smokeless tobacco history on file. He reports that he drinks about 1.8 oz of alcohol per week. He reports that he does not use illicit drugs.  REVIEW OF SYSTEMS:   Constitutional: Negative for fever, chills, weight loss, malaise/fatigue and diaphoresis.  HENT: Negative for hearing loss, ear pain, nosebleeds, congestion, sore throat, neck pain, tinnitus and ear discharge.   Eyes: Negative for blurred vision, double vision, photophobia, pain, discharge and redness.  Respiratory: Negative for cough, hemoptysis, sputum production, shortness of breath, wheezing and stridor.   Cardiovascular: Negative for chest pain, palpitations, orthopnea, claudication, leg swelling and PND.  Gastrointestinal: Negative for heartburn, nausea, vomiting, abdominal pain, diarrhea, constipation, blood in stool and melena.  Genitourinary: Negative for dysuria, urgency, frequency, hematuria and flank pain.  Musculoskeletal: Negative for myalgias, back pain, joint pain and falls.  Skin: Negative for itching and rash.  Neurological: Negative for  dizziness, tingling, tremors, sensory change, speech change, focal weakness, seizures, loss of consciousness, weakness and headaches.    Endo/Heme/Allergies: Negative for environmental allergies and polydipsia. Does not bruise/bleed easily.  SUBJECTIVE:   Patient states that he has been short of breath over several months and [facility] He states that he feels short of breath with movements. F  VITAL SIGNS: Temp:  [98 F (36.7 C)-98.3 F (36.8 C)] 98.3 F (36.8 C) (06/06 1652) Pulse Rate:  [60-86] 78 (06/06 1652) Resp:  [18-24] 18 (06/06 1652) BP: (135-161)/(62-87) 135/62 mmHg (06/06 1652) SpO2:  [92 %-100 %] 92 % (06/06 1652) Weight:  [167 lb 14.4 oz (76.159 kg)] 167 lb 14.4 oz (76.159 kg) (06/06 0114)  PHYSICAL EXAMINATION: General: Obese white male, found on 2 L of oxygen, appears to be in no acute distress Neuro:  Awake, alert, oriented, follows commands, no focal deficits HEENT:  Atraumatic, normocephalic, no discharge, no JVD appreciated Cardiovascular:  S1 and S2, regular rate and rhythm, no murmur rub or gallop noted Lungs:  diminished bases, no wheezes, crackles, rhonchi noted Abdomen: Soft, nontender, positive bowel sounds Musculoskeletal:  No inflammation/deformity noted Skin: Grossly intact   Recent Labs Lab 09/01/15 1705 09/02/15 0432  NA 139 140  K 3.7 4.0  CL 96* 97*  CO2 35* 33*  BUN 19 16  CREATININE 0.98 0.89  GLUCOSE 110* 194*    Recent Labs Lab 09/01/15 1705 09/02/15 0432  HGB 14.6 14.5  HCT 43.0 44.3  WBC 10.1 9.6  PLT 238 211   Dg Chest 2 View  09/01/2015  CLINICAL DATA:  60 year old male with shortness of breath EXAM: CHEST  2 VIEW COMPARISON:  Radiograph dated 12/26/2009 FINDINGS: There is shallow inspiration. Bibasilar atelectatic changes noted. Pneumonia is less likely. Clinical correlation is recommended. There is no pleural effusion or pneumothorax. Stable cardiac silhouette. There is degenerative changes of the shoulders and spine. Lower cervical fixation plate and screws noted. No acute osseous pathology. IMPRESSION: Shallow  inspiration with bibasilar atelectasis. Electronically Signed   By: Elgie CollardArash  Radparvar M.D.   On: 09/01/2015 21:21    ASSESSMENT / PLAN: Assessment -COPD exacerbation -Congestive heart failure (diastolic) -History of tobacco abuse -Obstructive sleep apnea  PLAN Continue diuresing Continue methyl prednisone DuoNeb every 4 hours Flutter valve/incentive spirometer CPAP at night Antibiotics per primary   Bincy Varughese,AG-ACNP Pulmonary & Critical Care Pulmonary and Critical Care Medicine [facility]   09/02/2015, 6:38 PM  STAFF NOTE: I, Dr. Lucie LeatherKurian David Byren Pankow,  have personally reviewed patient's available data, including medical history, events of note, physical examination and test results as part of my evaluation. I have discussed with NP and other care providers such as pharmacist, RN and RRT.  In addition,  I personally evaluated patient and elicited key findings  Alert and awake, still with wheezing,, slight increased WOB   A:acute copd exacerbation with diastolic heart failure  P: aggressive BD therapy Start AC, LABA/steroids, pulmicort and dounebs Cont IV steroids and IV abx    The Rest per NP whose note is outlined above and that I agree with  I have personally reviewed/obtained a history, examined the patient, evaluated Pertinent laboratory and RadioGraphic/imaging results, and  formulated the assessment and plan   The Patient requires high complexity decision making for assessment and support, frequent evaluation and titration of therapies, application of advanced monitoring technologies and extensive interpretation of multiple databases.    Aaron Doyle, M.D.  [facility]  [facility]  Medical Director [place] Director [facility] Cardio-Pulmonary Department

## 2015-09-03 DIAGNOSIS — I5032 Chronic diastolic (congestive) heart failure: Secondary | ICD-10-CM

## 2015-09-03 DIAGNOSIS — G4733 Obstructive sleep apnea (adult) (pediatric): Secondary | ICD-10-CM

## 2015-09-03 LAB — GLUCOSE, CAPILLARY
GLUCOSE-CAPILLARY: 192 mg/dL — AB (ref 65–99)
GLUCOSE-CAPILLARY: 197 mg/dL — AB (ref 65–99)
Glucose-Capillary: 207 mg/dL — ABNORMAL HIGH (ref 65–99)

## 2015-09-03 MED ORDER — ALBUTEROL SULFATE (2.5 MG/3ML) 0.083% IN NEBU
2.5000 mg | INHALATION_SOLUTION | RESPIRATORY_TRACT | Status: DC | PRN
  Administered 2015-09-03 – 2015-09-04 (×3): 2.5 mg via RESPIRATORY_TRACT
  Filled 2015-09-03 (×3): qty 3

## 2015-09-03 MED ORDER — INSULIN ASPART 100 UNIT/ML ~~LOC~~ SOLN
0.0000 [IU] | Freq: Every day | SUBCUTANEOUS | Status: DC
  Administered 2015-09-03: 2 [IU] via SUBCUTANEOUS
  Filled 2015-09-03: qty 2

## 2015-09-03 MED ORDER — AZITHROMYCIN 500 MG PO TABS
500.0000 mg | ORAL_TABLET | Freq: Every day | ORAL | Status: DC
  Administered 2015-09-03: 500 mg via ORAL
  Filled 2015-09-03: qty 1

## 2015-09-03 MED ORDER — INSULIN ASPART 100 UNIT/ML ~~LOC~~ SOLN
0.0000 [IU] | Freq: Three times a day (TID) | SUBCUTANEOUS | Status: DC
  Administered 2015-09-03: 2 [IU] via SUBCUTANEOUS
  Administered 2015-09-04: 1 [IU] via SUBCUTANEOUS
  Administered 2015-09-04: 3 [IU] via SUBCUTANEOUS
  Filled 2015-09-03: qty 2
  Filled 2015-09-03: qty 1
  Filled 2015-09-03: qty 2

## 2015-09-03 MED ORDER — PREDNISONE 20 MG PO TABS
40.0000 mg | ORAL_TABLET | Freq: Every day | ORAL | Status: DC
  Administered 2015-09-04: 40 mg via ORAL
  Filled 2015-09-03: qty 2

## 2015-09-03 NOTE — Progress Notes (Addendum)
[facility] - [place] [facility]   PATIENT NAME: Aaron Doyle    MRN#:  [id number]  DATE OF BIRTH:  1955/06/11  SUBJECTIVE:  Hospital Day: 2 days Aaron NorlanderClyde Spells is a 60 y.o. male presenting with Shortness of Breath .   Overnight events: No overnight events Interval Events: Some improvement but continued dyspnea on exertion  REVIEW OF SYSTEMS:  CONSTITUTIONAL: No fever, fatigue or weakness.  EYES: No blurred or double vision.  EARS, NOSE, AND THROAT: No tinnitus or ear pain.  RESPIRATORY: Positive cough, shortness of breath, denies wheezing or hemoptysis.  CARDIOVASCULAR: No chest pain, orthopnea, positive edema.  GASTROINTESTINAL: No nausea, vomiting, diarrhea or abdominal pain.  GENITOURINARY: No dysuria, hematuria.  ENDOCRINE: No polyuria, nocturia,  HEMATOLOGY: No anemia, easy bruising or bleeding SKIN: No rash or lesion. MUSCULOSKELETAL: No joint pain or arthritis.   NEUROLOGIC: No tingling, numbness, weakness.  PSYCHIATRY: No anxiety or depression.   DRUG ALLERGIES:  No Known Allergies  VITALS:  Blood pressure 142/72, pulse 66, temperature 98.3 F (36.8 C), temperature source Oral, resp. rate 20, height 6' (1.829 m), weight 167 lb 14.4 oz (76.159 kg), SpO2 97 %.  PHYSICAL EXAMINATION:  VITAL SIGNS: Filed Vitals:   09/03/15 0805 09/03/15 1145  BP: 133/63 142/72  Pulse: 58 66  Temp: 97.6 F (36.4 C) 98.3 F (36.8 C)  Resp:  20   GENERAL:59 y.o.male currently in no acute distress.  HEAD: Normocephalic, atraumatic.  EYES: Pupils equal, round, reactive to light. Extraocular muscles intact. No scleral icterus.  MOUTH: Moist mucosal membrane. Dentition intact. No abscess noted.  EAR, NOSE, THROAT: Clear without exudates. No external lesions.  NECK: Supple. No thyromegaly. No nodules. No JVD.  PULMONARY: Diminished breath sounds with basilar rhonchi without wheeze No use of accessory muscles, Good respiratory effort. good air entry  bilaterally CHEST: Nontender to palpation.  CARDIOVASCULAR: S1 and S2. Regular rate and rhythm. No murmurs, rubs, or gallops. 2+ edema. Pedal pulses 2+ bilaterally.  GASTROINTESTINAL: Soft, nontender, nondistended. No masses. Positive bowel sounds. No hepatosplenomegaly.  MUSCULOSKELETAL: No swelling, clubbing, or edema. Range of motion full in all extremities.  NEUROLOGIC: Cranial nerves II through XII are intact. No gross focal neurological deficits. Sensation intact. Reflexes intact.  SKIN: No ulceration, lesions, rashes, or cyanosis. Skin warm and dry. Turgor intact.  PSYCHIATRIC: Mood, affect within normal limits. The patient is awake, alert and oriented x 3. Insight, judgment intact.      LABORATORY PANEL:   CBC  Recent Labs Lab 09/02/15 0432  WBC 9.6  HGB 14.5  HCT 44.3  PLT 211   ------------------------------------------------------------------------------------------------------------------  Chemistries   Recent Labs Lab 09/02/15 0432  NA 140  K 4.0  CL 97*  CO2 33*  GLUCOSE 194*  BUN 16  CREATININE 0.89  CALCIUM 8.8*   ------------------------------------------------------------------------------------------------------------------  Cardiac Enzymes  Recent Labs Lab 09/01/15 1705  TROPONINI <0.03   ------------------------------------------------------------------------------------------------------------------  RADIOLOGY:  Dg Chest 2 View  09/01/2015  CLINICAL DATA:  60 year old male with shortness of breath EXAM: CHEST  2 VIEW COMPARISON:  Radiograph dated 12/26/2009 FINDINGS: There is shallow inspiration. Bibasilar atelectatic changes noted. Pneumonia is less likely. Clinical correlation is recommended. There is no pleural effusion or pneumothorax. Stable cardiac silhouette. There is degenerative changes of the shoulders and spine. Lower cervical fixation plate and screws noted. No acute osseous pathology. IMPRESSION: Shallow inspiration with bibasilar  atelectasis. Electronically Signed   By: Elgie CollardArash  Radparvar M.D.   On: 09/01/2015 21:21  EKG:   Orders placed or performed during the hospital encounter of 09/01/15  . ED EKG within 10 minutes  . ED EKG within 10 minutes  . EKG 12-Lead  . EKG 12-Lead    ASSESSMENT AND PLAN:   Aaron Doyle is a 60 y.o. male presenting with Shortness of Breath . Admitted 09/01/2015 : Day #: 2 days 1. Acute hypoxic respiratory failure: COPD exacerbation, continue supplemental oxygen and scheduled breathing treatments steroidsAppreciate pulmonology recommendations 2. Diastolic congestive heart failure acute on chronic: Continue with IV diuresis, consult cardiology patient follows with Dr.callwood 3. Hyperlipidemia unspecified Lipitor 4. Hyperglycemia: Likely steroid-induced, check hemoglobin A1c sliding scale coverage in the meantime   All the records are reviewed and case discussed with Care Management/Social Workerr. Management plans discussed with the patient, family and they are in agreement.  CODE STATUS: full TOTAL TIME TAKING CARE OF THIS PATIENT: 33 minutes.   POSSIBLE D/C IN 1-2DAYS, DEPENDING ON CLINICAL CONDITION.   Hower,  Mardi Mainland.D on 09/03/2015 at 1:38 PM  Between 7am to 6pm - Pager - [phone]  After 6pm: House Pager: - [phone]  [facility]  Office  [phone]  CC: Primary care physician; Abran Richard, PA-C

## 2015-09-03 NOTE — Consult Note (Signed)
[facility] Cardiology  CARDIOLOGY CONSULT NOTE  Patient ID: Aaron Doyle MRN: [id number] DOB/AGE: 62-06-1955 62 y.o.  Admit date: 09/01/2015 Referring Physician Hower Primary Physician [place] Primary Cardiologist [facility] Reason for Consultation Congestive heart failure  HPI: 60 year old gentleman referred for evaluation of congestive heart failure. The patient presented to [facility] emergency room, with two-week history of productive cough, mild fever and chills, peripheral edema, and shortness of breath. Admission labs were notable for negative troponin. Chest x-ray did not reveal pulmonary edema, pleural effusion, or pneumonia. Patient was treated with intravenous furosemide, with diuresis and overall clinical improvement. She denies chest pain. He underwent recent right and left heart cardiac catheterization 06/24/2015 which revealed an, normal left ventricular function, and evidence for mild portal hypertension with a mean PA pressure 32 mmHg.  Review of systems complete and found to be negative unless listed above     Past Medical History  Diagnosis Date  . Mixed hyperlipidemia   . Hypertension   . Nonrheumatic mitral valve disorder   . Osteoarthritis of knee   . Chronic gout   . Colon polyp   . Glaucoma   . Obesity   . Shortness of breath   . CHF (congestive heart failure) (HCC)   . Bilateral lower extremity edema   . Obstructive sleep apnea   . Heart murmur   . Smoking   . Mild pulmonary hypertension (HCC)     Past Surgical History  Procedure Laterality Date  . Cardiac catheterization N/A 06/24/2015    Procedure: Right/Left Heart Cath and Coronary Angiography;  Surgeon: Alwyn Pea, MD;  Location: [facility] INVASIVE CV LAB;  Service: Cardiovascular;  Laterality: N/A;  . Cervical fusion      Prescriptions prior to admission  Medication Sig Dispense Refill Last Dose  . allopurinol (ZYLOPRIM) 300 MG tablet Take 300 mg by mouth at bedtime.   Past Week at Unknown time  . aspirin 81  MG tablet Take 81 mg by mouth daily.   09/01/2015 at Unknown time  . atorvastatin (LIPITOR) 40 MG tablet Take 40 mg by mouth daily.   09/01/2015 at Unknown time  . benazepril (LOTENSIN) 40 MG tablet Take 40 mg by mouth daily.   09/01/2015 at Unknown time  . carvedilol (COREG) 6.25 MG tablet Take 6.25 mg by mouth 2 (two) times daily with a meal.   09/01/2015 at Unknown time  . furosemide (LASIX) 40 MG tablet Take 40 mg by mouth 2 (two) times daily.    09/01/2015 at Unknown time  . naproxen (NAPROSYN) 250 MG tablet Take 250 mg by mouth 2 (two) times daily with a meal. Reported on 06/24/2015   09/01/2015 at Unknown time  . Travoprost, BAK Free, (TRAVATAN) 0.004 % SOLN ophthalmic solution Place 1 drop into both eyes at bedtime.   09/01/2015 at Unknown time   Social History   Social History  . Marital Status: Single    Spouse Name: N/A  . Number of Children: N/A  . Years of Education: N/A   Occupational History  . Not on file.   Social History Main Topics  . Smoking status: Current Every Day Smoker -- 0.25 packs/day for 40 years    Types: Cigarettes  . Smokeless tobacco: Not on file  . Alcohol Use: 1.8 oz/week    3 Shots of liquor per week  . Drug Use: No  . Sexual Activity: Not on file   Other Topics Concern  . Not on file   Social History Narrative  Family History  Problem Relation Age of Onset  . Arthritis    . Heart attack    . Multiple sclerosis        Review of systems complete and found to be negative unless listed above      PHYSICAL EXAM  General: Well developed, well nourished, in no acute distress HEENT:  Normocephalic and atramatic Neck:  No JVD.  Lungs: Clear bilaterally to auscultation and percussion. Heart: HRRR . Normal S1 and S2 without gallops or murmurs.  Abdomen: Bowel sounds are positive, abdomen soft and non-tender  Msk:  Back normal, normal gait. Normal strength and tone for age. Extremities: No clubbing, cyanosis or edema.   Neuro: Alert and oriented X  3. Psych:  Good affect, responds appropriately  Labs:   Lab Results  Component Value Date   WBC 9.6 09/02/2015   HGB 14.5 09/02/2015   HCT 44.3 09/02/2015   MCV 100.5* 09/02/2015   PLT 211 09/02/2015    Recent Labs Lab 09/02/15 0432  NA 140  K 4.0  CL 97*  CO2 33*  BUN 16  CREATININE 0.89  CALCIUM 8.8*  GLUCOSE 194*   Lab Results  Component Value Date   TROPONINI <0.03 09/01/2015   No results found for: CHOL No results found for: HDL No results found for: LDLCALC No results found for: TRIG No results found for: CHOLHDL No results found for: LDLDIRECT    Radiology: Dg Chest 2 View  09/01/2015  CLINICAL DATA:  60 year old male with shortness of breath EXAM: CHEST  2 VIEW COMPARISON:  Radiograph dated 12/26/2009 FINDINGS: There is shallow inspiration. Bibasilar atelectatic changes noted. Pneumonia is less likely. Clinical correlation is recommended. There is no pleural effusion or pneumothorax. Stable cardiac silhouette. There is degenerative changes of the shoulders and spine. Lower cervical fixation plate and screws noted. No acute osseous pathology. IMPRESSION: Shallow inspiration with bibasilar atelectasis. Electronically Signed   By: Elgie CollardArash  Radparvar M.D.   On: 09/01/2015 21:21    EKG: Sinus rhythm, first-degree AV block, right bundle branch block  ASSESSMENT AND PLAN:   1. Respiratory failure, likely multifactorial, secondary to COPD/bronchitis, with acute on chronic diastolic congestive heart failure, with peripheral edema and shortness of breath, improved after initial antibiotic therapy, and diuresis. Patient with known normal coronary anatomy and normal left ventricular function.  Recommendations  1. Agree with overall current therapy 2. Continue diuresis 3. Carefully monitor renal status 4. No further cardiac diagnostics at this time  Signed: Onnie Alatorre MD,PhD, [facility] 09/03/2015, 1:42 PM

## 2015-09-03 NOTE — Progress Notes (Signed)
[facility]* [facility] Pulmonary Medicine    60 year old male with history of hypertension, hyperlipidemia, obesity, congestive heart failure presented with increased shortness of breath possibly related to CHF/COPD exacerbation   Assessment and Plan:  Acute respiratory failure, likely secondary to acute bronchitis leading to acute exacerbation of COPD. -Continue prednisone, weaning, continue Spiriva, Dulera. -Follow-up outpatient in 3-4 weeks, we'll check folate function testing at that time. -Elevated right diaphragm, likely contributing to dyspnea.  Pulmonary hypertension. -Mean PAP equals 32. Outpatient evaluation for sleep apnea treatment, and nocturnal oximetry.  Obstructive sleep apnea. -Noncompliant with therapy, outpatient follow-up for further management to see if we can help get him back on CPAP.  Morbid obesity. -Likely contributing to dyspnea, weight loss would be beneficial.  Nicotine abuse. -Discussed smoking cessation.  Alcohol abuse. -Discussed cessation.  The patient's symptoms appear to have stabilized, pulmonary service will sign off for now, please call if there are any further questions or concerns. I've asked the patient to follow up with us in 3-4 weeks in outpatient basis for further follow-up.  Date: 09/03/2015  MRN# [id number] Aaron Doyle [birth year]   Aaron Doyle is a 60 y.o. old male seen in follow up for chief complaint of  Chief Complaint  Patient presents with  . Shortness of Breath     HPI:   Patient is feeling significantly better than his presentation.  Review of testing: heart cardiac catheterization 12/28/14: Mean PA pressure equals 32. EF equals 55%. -Review of CT abdomen pelvis 12/28/14: Lower cuts of the lung appear to be normal, elevated right diaphragm.  Allergies:  Review of patient's allergies indicates no known allergies.  Review of Systems: Gen:  Denies  fever, sweats. HEENT: Denies blurred vision. Cvc:  No dizziness, chest  pain or heaviness Resp:   Denies cough or sputum porduction. Gi: Denies swallowing difficulty, stomach pain. constipation, bowel incontinence Gu:  Denies bladder incontinence, burning urine Ext:   No Joint pain, stiffness. Skin: No skin rash, easy bruising. Endoc:  No polyuria, polydipsia. Psych: No depression, insomnia. Other:  All other systems were reviewed and found to be negative other than what is mentioned in the HPI.   Physical Examination:   VS: BP 114/65 mmHg  Pulse 63  Temp(Src) 97.9 F (36.6 C) (Oral)  Resp 19  Ht 6' (1.829 m)  Wt 167 lb 14.4 oz (76.159 kg)  BMI 22.77 kg/m2  SpO2 95%  General Appearance: No distress  Neuro:without focal findings,  speech normal,  HEENT: PERRLA, EOM intact. Pulmonary: normal breath sounds, No wheezing.   CardiovascularNormal S1,S2.  No m/r/g.   Abdomen: Benign, Soft, non-tender. Renal:  No costovertebral tenderness  GU:  Not performed at this time. Endoc: No evident thyromegaly, no signs of acromegaly. Skin:   warm, no rash. Extremities: normal, no cyanosis, or clubbing. 2+ bilateral lower extremity edema.   LABORATORY PANEL:   CBC  Recent Labs Lab 09/02/15 0432  WBC 9.6  HGB 14.5  HCT 44.3  PLT 211   ------------------------------------------------------------------------------------------------------------------  Chemistries   Recent Labs Lab 09/02/15 0432  NA 140  K 4.0  CL 97*  CO2 33*  GLUCOSE 194*  BUN 16  CREATININE 0.89  CALCIUM 8.8*   ------------------------------------------------------------------------------------------------------------------  Cardiac Enzymes  Recent Labs Lab 09/01/15 1705  TROPONINI <0.03   ------------------------------------------------------------  RADIOLOGY:   No results found for this or any previous visit. Results for orders placed during the hospital encounter of 09/01/15  DG Chest 2 View   Narrative CLINICAL DATA:  60 year old male with shortness of  breath  EXAM: CHEST  2 VIEW  COMPARISON:  Radiograph dated 12/26/2009  FINDINGS: There is shallow inspiration. Bibasilar atelectatic changes noted. Pneumonia is less likely. Clinical correlation is recommended. There is no pleural effusion or pneumothorax. Stable cardiac silhouette. There is degenerative changes of the shoulders and spine. Lower cervical fixation plate and screws noted. No acute osseous pathology.  IMPRESSION: Shallow inspiration with bibasilar atelectasis.   Electronically Signed   By: Elgie Collard M.D.   On: 09/01/2015 21:21    ------------------------------------------------------------------------------------------------------------------  Thank  you for allowing [facility] to assist in the care of your patient. Our recommendations are noted above.  Please contact us if we can be of further service.   Wells Guiles, MD.  [place] [facility] Office Number: [phone]  Santiago Glad, M.D.  Stephanie Acre, M.D.  Billy Fischer, M.D  09/03/2015

## 2015-09-04 LAB — GLUCOSE, CAPILLARY
GLUCOSE-CAPILLARY: 141 mg/dL — AB (ref 65–99)
Glucose-Capillary: 210 mg/dL — ABNORMAL HIGH (ref 65–99)

## 2015-09-04 LAB — HEMOGLOBIN A1C: HEMOGLOBIN A1C: 6.5 % — AB (ref 4.0–6.0)

## 2015-09-04 MED ORDER — PREDNISONE 10 MG (21) PO TBPK: 10.0000 mg | ORAL_TABLET | Freq: Every day | ORAL | Status: DC

## 2015-09-04 MED ORDER — TIOTROPIUM BROMIDE MONOHYDRATE 18 MCG IN CAPS: 18.0000 ug | ORAL_CAPSULE | Freq: Every day | RESPIRATORY_TRACT | Status: DC

## 2015-09-04 MED ORDER — MOMETASONE FURO-FORMOTEROL FUM 100-5 MCG/ACT IN AERO: 2.0000 | INHALATION_SPRAY | Freq: Two times a day (BID) | RESPIRATORY_TRACT | Status: DC

## 2015-09-04 MED ORDER — AZITHROMYCIN 250 MG PO TABS
250.0000 mg | ORAL_TABLET | Freq: Every day | ORAL | Status: DC
Start: 2015-09-04 — End: 2015-09-26

## 2015-09-04 NOTE — Progress Notes (Signed)
Inpatient Diabetes Program Recommendations  AACE/ADA: New Consensus Statement on Inpatient Glycemic Control (2015)  Target Ranges:  Prepandial:   less than 140 mg/dL      Peak postprandial:   less than 180 mg/dL (1-2 hours)      Critically ill patients:  140 - 180 mg/dL   Lab Results  Component Value Date   GLUCAP 210* 09/04/2015   HGBA1C 6.5* 09/03/2015    Spoke with patient regarding elevated blood sugars- explained the relationship between steroids and elevated blood sugars.  I have recommended he speak to his PCP re: elevated sugar while he was in the hospital.  He tells me his sister has been encouraging him to be checked for diabetes since she has diabetes," it runs in the family, and she thinks if she has it I should have it".  Patient confirms he sees his family doctor often and will follow up with her when discharged and will discuss elevated sugars.  Susette RacerJulie Leonna Schlee, RN, BA, MHA, CDE Diabetes Coordinator Inpatient Diabetes Program  [phone] (Team Pager) [phone] [facility] Office) 09/04/2015 2:02 PM

## 2015-09-04 NOTE — Progress Notes (Signed)
Inpatient Diabetes Program Recommendations  AACE/ADA: New Consensus Statement on Inpatient Glycemic Control (2015)  Target Ranges:  Prepandial:   less than 140 mg/dL      Peak postprandial:   less than 180 mg/dL (1-2 hours)      Critically ill patients:  140 - 180 mg/dL   Lab Results  Component Value Date   GLUCAP 141* 09/04/2015   HGBA1C 6.5* 09/03/2015    Review of Glycemic Control  Results for Aaron Doyle, Aaron Doyle (MRN [id number]) as of 09/04/2015 11:44  Ref. Range 09/02/2015 16:49 09/03/2015 14:14 09/03/2015 16:43 09/03/2015 21:37 09/04/2015 07:42  Glucose-Capillary Latest Ref Range: 65-99 mg/dL 409198 (H) 811192 (H) 914197 (H) 207 (H) 141 (H)    Diabetes history: none noted Outpatient Diabetes medications: none Current orders for Inpatient glycemic control: Novolog 0-9 units tid, Novolog 0-5 units qhs  Inpatient Diabetes Program Recommendations:  A1C 6.5%- based on the ADA standards of care, an A1C>6.5% indicates a diagnosis of diabetes.  If appropriate, consider discussing with patient.  Susette RacerJulie Favor Kreh, RN, BA, MHA, CDE Diabetes Coordinator Inpatient Diabetes Program  [phone] (Team Pager) [phone] [facility] Office) 09/04/2015 1:54 PM

## 2015-09-04 NOTE — Progress Notes (Signed)
SATURATION QUALIFICATIONS: (This note is used to comply with regulatory documentation for home oxygen)  Patient Saturations on Room Air at Rest = 86%  Patient Saturations on 2 Liters of oxygen at Rest = 97%

## 2015-09-04 NOTE — Discharge Summary (Addendum)
[facility] [place] [facility]   PATIENT NAME: Aaron Doyle    MR#:  [id number]  DATE OF BIRTH:  11-20-1955  DATE OF ADMISSION:  09/01/2015 ADMITTING PHYSICIAN: Oralia Manis, MD  DATE OF DISCHARGE: 09/04/2015  PRIMARY CARE PHYSICIAN: Emilio Math B, PA-C    ADMISSION DIAGNOSIS:  Shortness of breath [R06.02]  DISCHARGE DIAGNOSIS:  Principal Problem:   COPD exacerbation (HCC)   Acute on chronic diastolic CHF (congestive heart failure)      SECONDARY DIAGNOSIS:   Past Medical History  Diagnosis Date  . Mixed hyperlipidemia   . Hypertension   . Nonrheumatic mitral valve disorder   . Osteoarthritis of knee   . Chronic gout   . Colon polyp   . Glaucoma   . Obesity   . Shortness of breath   . CHF (congestive heart failure) (HCC)   . Bilateral lower extremity edema   . Obstructive sleep apnea   . Heart murmur   . Smoking   . Mild pulmonary hypertension [facility])     HOSPITAL COURSE:  Aaron Doyle  is a 60 y.o. male admitted 09/01/2015 with chief complaint Shortness of Breath . Please see H&P performed by Oralia Manis, MD for further information. He presented with the above symptoms. Noted diffuse wheezing on exam, that in combination of long term smoking believed to be related to COPD exacerbation. He was also noted to have DOE and lower extremity edema indicating acute on chronic diastolic CHF. He was started on steroids, breathing treatments, iv diuresis with overall improvement of symptoms. He was also evaluated by pulmonology and cardiology during this stay.  DISCHARGE CONDITIONS:   stable  CONSULTS OBTAINED:  Treatment Team:  Marcina Millard, MD  DRUG ALLERGIES:  No Known Allergies  DISCHARGE MEDICATIONS:   Current Discharge Medication List    START taking these medications   Details  azithromycin (ZITHROMAX) 250 MG tablet Take 1 tablet (250 mg total) by mouth daily. Qty: 5 tablet, Refills: 0    mometasone-formoterol (DULERA)  100-5 MCG/ACT AERO Inhale 2 puffs into the lungs 2 (two) times daily. Qty: 13 g, Refills: 0    predniSONE (STERAPRED UNI-PAK 21 TAB) 10 MG (21) TBPK tablet Take 1 tablet (10 mg total) by mouth daily.  oral 1 day,  oral 2 days,  oral 2 days then stop Qty: 10 tablet, Refills: 0    tiotropium (SPIRIVA) 18 MCG inhalation capsule Place 1 capsule (18 mcg total) into inhaler and inhale daily. Qty: 30 capsule, Refills: 12      CONTINUE these medications which have NOT CHANGED   Details  allopurinol (ZYLOPRIM) 300 MG tablet Take 300 mg by mouth at bedtime.    aspirin 81 MG tablet Take 81 mg by mouth daily.    atorvastatin (LIPITOR) 40 MG tablet Take 40 mg by mouth daily.    benazepril (LOTENSIN) 40 MG tablet Take 40 mg by mouth daily.    carvedilol (COREG) 6.25 MG tablet Take 6.25 mg by mouth 2 (two) times daily with a meal.    furosemide (LASIX) 40 MG tablet Take 40 mg by mouth 2 (two) times daily.     naproxen (NAPROSYN) 250 MG tablet Take 250 mg by mouth 2 (two) times daily with a meal. Reported on 06/24/2015    Travoprost, BAK Free, (TRAVATAN) 0.004 % SOLN ophthalmic solution Place 1 drop into both eyes at bedtime.      STOP taking these medications     amLODipine (NORVASC) 10 MG  tablet      oxyCODONE-acetaminophen (PERCOCET) 7.5-325 MG tablet          DISCHARGE INSTRUCTIONS:    DIET:  Cardiac diet  DISCHARGE CONDITION:  Stable  ACTIVITY:  Activity as tolerated  OXYGEN:  Home Oxygen: Yes.     Oxygen Delivery: [facility]  DISCHARGE LOCATION:  home   If you experience worsening of your admission symptoms, develop shortness of breath, life threatening emergency, suicidal or homicidal thoughts you must seek medical attention immediately by calling 911 or calling your MD immediately  if symptoms less severe.  You Must read complete instructions/literature along with all the possible adverse reactions/side effects for all the Medicines you take and that have  been prescribed to you. Take any new Medicines after you have completely understood and accpet all the possible adverse reactions/side effects.   Please note  You were cared for by a hospitalist during your hospital stay. If you have any questions about your discharge medications or the care you received while you were in the hospital after you are discharged, you can call the unit and asked to speak with the hospitalist on call if the hospitalist that took care of you is not available. Once you are discharged, your primary care physician will handle any further medical issues. Please note that NO REFILLS for any discharge medications will be authorized once you are discharged, as it is imperative that you return to your primary care physician (or establish a relationship with a primary care physician if you do not have one) for your aftercare needs so that they can reassess your need for medications and monitor your lab values.    On the day of Discharge:   VITAL SIGNS:  Blood pressure 130/74, pulse 64, temperature 98 F (36.7 C), temperature source Oral, resp. rate 18, height 6' (1.829 m), weight 167 lb 14.4 oz (76.159 kg), SpO2 98 %.  I/O:   Intake/Output Summary (Last 24 hours) at 09/04/15 0913 Last data filed at 09/04/15 0736  Gross per 24 hour  Intake    873 ml  Output    250 ml  Net    623 ml    PHYSICAL EXAMINATION:  GENERAL:  60 y.o.-year-old patient lying in the bed with no acute distress.  EYES: Pupils equal, round, reactive to light and accommodation. No scleral icterus. Extraocular muscles intact.  HEENT: Head atraumatic, normocephalic. Oropharynx and nasopharynx clear.  NECK:  Supple, no jugular venous distention. No thyroid enlargement, no tenderness.  LUNGS: Normal breath sounds bilaterally, no wheezing, rales,rhonchi or crepitation. No use of accessory muscles of respiration.  CARDIOVASCULAR: S1, S2 normal. No murmurs, rubs, or gallops.  ABDOMEN: Soft, non-tender,  non-distended. Bowel sounds present. No organomegaly or mass.  EXTREMITIES: 1+ edema, no cyanosis, or clubbing.  NEUROLOGIC: Cranial nerves II through XII are intact. Muscle strength 5/5 in all extremities. Sensation intact. Gait not checked.  PSYCHIATRIC: The patient is alert and oriented x 3.  SKIN: No obvious rash, lesion, or ulcer.   DATA REVIEW:   CBC  Recent Labs Lab 09/02/15 0432  WBC 9.6  HGB 14.5  HCT 44.3  PLT 211    Chemistries   Recent Labs Lab 09/02/15 0432  NA 140  K 4.0  CL 97*  CO2 33*  GLUCOSE 194*  BUN 16  CREATININE 0.89  CALCIUM 8.8*    Cardiac Enzymes  Recent Labs Lab 09/01/15 1705  TROPONINI <0.03    Microbiology Results  Results for orders placed or performed  during the hospital encounter of 01/01/10  Surgical pcr screen     Status: None   Collection Time: 12/26/09 11:00 AM  Result Value Ref Range Status   MRSA, PCR NEGATIVE NEGATIVE Final   Staphylococcus aureus  NEGATIVE Final    NEGATIVE        The Xpert SA Assay (FDA approved for NASAL specimens only), is one component of a comprehensive surveillance program.  It is not intended to diagnose infection nor to guide or monitor treatment.    RADIOLOGY:  No results found.   Management plans discussed with the patient, family and they are in agreement.  CODE STATUS:     Code Status Orders        Start     Ordered   09/02/15 0103  Full code   Continuous     09/02/15 0102    Code Status History    Date Active Date Inactive Code Status Order ID Comments User Context   This patient has a current code status but no historical code status.      TOTAL TIME TAKING CARE OF THIS PATIENT: 28 minutes.    Aaron Doyle,  Mardi Mainland.D on 09/04/2015 at 9:13 AM  Between 7am to 6pm - Pager - [phone]  After 6pm go to [email or web address] - Scientist, research (life sciences) [facility]  Office  [phone]  CC: Primary care physician; Abran Richard, PA-C

## 2015-09-04 NOTE — Care Management (Signed)
O2 delivered to this room by [facility]. Patient eager to discharge. No further RNCM needs.

## 2015-09-05 ENCOUNTER — Telehealth: Payer: Self-pay | Admitting: *Deleted

## 2015-09-05 NOTE — Telephone Encounter (Signed)
-----   Message from Shane CrutchPradeep Ramachandran, MD sent at 09/03/2015  2:59 PM EDT ----- Regarding: hfu Pt needs follow up with me in 3 to 4 weeks with PFT for COPD.

## 2015-09-05 NOTE — Telephone Encounter (Signed)
appt for PFT and hosp f/u scheduled. Nothing further needed.

## 2015-09-26 ENCOUNTER — Encounter: Payer: Self-pay | Admitting: Emergency Medicine

## 2015-09-26 ENCOUNTER — Inpatient Hospital Stay
Admission: EM | Admit: 2015-09-26 | Discharge: 2015-09-28 | DRG: 683 | Disposition: A | Discharge status: Home / Self Care | Payer: Medicaid Other | Attending: Internal Medicine | Admitting: Internal Medicine

## 2015-09-26 ENCOUNTER — Inpatient Hospital Stay: Payer: Medicaid Other

## 2015-09-26 DIAGNOSIS — J9610 Chronic respiratory failure, unspecified whether with hypoxia or hypercapnia: Secondary | ICD-10-CM | POA: Diagnosis present

## 2015-09-26 DIAGNOSIS — T501X5A Adverse effect of loop [high-ceiling] diuretics, initial encounter: Secondary | ICD-10-CM | POA: Diagnosis present

## 2015-09-26 DIAGNOSIS — E872 Acidosis: Secondary | ICD-10-CM | POA: Diagnosis present

## 2015-09-26 DIAGNOSIS — Y92009 Unspecified place in unspecified non-institutional (private) residence as the place of occurrence of the external cause: Secondary | ICD-10-CM

## 2015-09-26 DIAGNOSIS — Z9981 Dependence on supplemental oxygen: Secondary | ICD-10-CM

## 2015-09-26 DIAGNOSIS — N183 Chronic kidney disease, stage 3 (moderate): Secondary | ICD-10-CM | POA: Diagnosis present

## 2015-09-26 DIAGNOSIS — I5032 Chronic diastolic (congestive) heart failure: Secondary | ICD-10-CM | POA: Diagnosis present

## 2015-09-26 DIAGNOSIS — Z7982 Long term (current) use of aspirin: Secondary | ICD-10-CM | POA: Diagnosis not present

## 2015-09-26 DIAGNOSIS — N179 Acute kidney failure, unspecified: Secondary | ICD-10-CM | POA: Diagnosis not present

## 2015-09-26 DIAGNOSIS — Z8249 Family history of ischemic heart disease and other diseases of the circulatory system: Secondary | ICD-10-CM

## 2015-09-26 DIAGNOSIS — H409 Unspecified glaucoma: Secondary | ICD-10-CM | POA: Diagnosis present

## 2015-09-26 DIAGNOSIS — I959 Hypotension, unspecified: Secondary | ICD-10-CM | POA: Diagnosis present

## 2015-09-26 DIAGNOSIS — F17210 Nicotine dependence, cigarettes, uncomplicated: Secondary | ICD-10-CM | POA: Diagnosis present

## 2015-09-26 DIAGNOSIS — I951 Orthostatic hypotension: Secondary | ICD-10-CM | POA: Diagnosis not present

## 2015-09-26 DIAGNOSIS — Z820 Family history of epilepsy and other diseases of the nervous system: Secondary | ICD-10-CM

## 2015-09-26 DIAGNOSIS — M1A9XX0 Chronic gout, unspecified, without tophus (tophi): Secondary | ICD-10-CM | POA: Diagnosis present

## 2015-09-26 DIAGNOSIS — Z6835 Body mass index (BMI) 35.0-35.9, adult: Secondary | ICD-10-CM | POA: Diagnosis not present

## 2015-09-26 DIAGNOSIS — E669 Obesity, unspecified: Secondary | ICD-10-CM | POA: Diagnosis present

## 2015-09-26 DIAGNOSIS — E871 Hypo-osmolality and hyponatremia: Secondary | ICD-10-CM | POA: Diagnosis present

## 2015-09-26 DIAGNOSIS — M179 Osteoarthritis of knee, unspecified: Secondary | ICD-10-CM | POA: Diagnosis present

## 2015-09-26 DIAGNOSIS — T39315A Adverse effect of propionic acid derivatives, initial encounter: Secondary | ICD-10-CM | POA: Diagnosis present

## 2015-09-26 DIAGNOSIS — G4733 Obstructive sleep apnea (adult) (pediatric): Secondary | ICD-10-CM | POA: Diagnosis present

## 2015-09-26 DIAGNOSIS — Z79899 Other long term (current) drug therapy: Secondary | ICD-10-CM | POA: Diagnosis not present

## 2015-09-26 DIAGNOSIS — E1122 Type 2 diabetes mellitus with diabetic chronic kidney disease: Secondary | ICD-10-CM | POA: Diagnosis present

## 2015-09-26 DIAGNOSIS — I130 Hypertensive heart and chronic kidney disease with heart failure and stage 1 through stage 4 chronic kidney disease, or unspecified chronic kidney disease: Secondary | ICD-10-CM | POA: Diagnosis present

## 2015-09-26 DIAGNOSIS — J449 Chronic obstructive pulmonary disease, unspecified: Secondary | ICD-10-CM | POA: Diagnosis present

## 2015-09-26 DIAGNOSIS — Z23 Encounter for immunization: Secondary | ICD-10-CM | POA: Diagnosis not present

## 2015-09-26 DIAGNOSIS — E782 Mixed hyperlipidemia: Secondary | ICD-10-CM | POA: Diagnosis present

## 2015-09-26 DIAGNOSIS — E875 Hyperkalemia: Secondary | ICD-10-CM | POA: Diagnosis present

## 2015-09-26 LAB — BASIC METABOLIC PANEL
Anion gap: 16 — ABNORMAL HIGH (ref 5–15)
BUN: 87 mg/dL — AB (ref 6–20)
CALCIUM: 8.5 mg/dL — AB (ref 8.9–10.3)
CO2: 23 mmol/L (ref 22–32)
CREATININE: 6.06 mg/dL — AB (ref 0.61–1.24)
Chloride: 94 mmol/L — ABNORMAL LOW (ref 101–111)
GFR calc non Af Amer: 9 mL/min — ABNORMAL LOW (ref >60)
GFR, EST AFRICAN AMERICAN: 10 mL/min — AB (ref >60)
Glucose, Bld: 133 mg/dL — ABNORMAL HIGH (ref 65–99)
Potassium: 4.3 mmol/L (ref 3.5–5.1)
Sodium: 133 mmol/L — ABNORMAL LOW (ref 135–145)

## 2015-09-26 LAB — CBC
HCT: 41.1 % (ref 40.0–52.0)
Hemoglobin: 13.9 g/dL (ref 13.0–18.0)
MCH: 32.5 pg (ref 26.0–34.0)
MCHC: 33.8 g/dL (ref 32.0–36.0)
MCV: 96.2 fL (ref 80.0–100.0)
PLATELETS: 200 10*3/uL (ref 150–440)
RBC: 4.28 MIL/uL — AB (ref 4.40–5.90)
RDW: 13.6 % (ref 11.5–14.5)
WBC: 5.5 10*3/uL (ref 3.8–10.6)

## 2015-09-26 LAB — GLUCOSE, CAPILLARY
Glucose-Capillary: 88 mg/dL (ref 65–99)
Glucose-Capillary: 107 mg/dL — ABNORMAL HIGH (ref 65–99)

## 2015-09-26 LAB — TSH: TSH: 3.743 u[IU]/mL (ref 0.350–4.500)

## 2015-09-26 LAB — MRSA PCR SCREENING: MRSA by PCR: NEGATIVE

## 2015-09-26 LAB — TROPONIN I: Troponin I: <0.03 ng/mL (ref <0.03)

## 2015-09-26 LAB — MAGNESIUM: MAGNESIUM: 2.2 mg/dL (ref 1.7–2.4)

## 2015-09-26 MED ORDER — CETYLPYRIDINIUM CHLORIDE 0.05 % MT LIQD
7.0000 mL | Freq: Two times a day (BID) | OROMUCOSAL | Status: DC
  Administered 2015-09-26 – 2015-09-27 (×3): 7 mL via OROMUCOSAL

## 2015-09-26 MED ORDER — PNEUMOCOCCAL VAC POLYVALENT 25 MCG/0.5ML IJ INJ
0.5000 mL | INJECTION | INTRAMUSCULAR | Status: AC
  Administered 2015-09-28: 0.5 mL via INTRAMUSCULAR
  Filled 2015-09-26 (×2): qty 0.5

## 2015-09-26 MED ORDER — MOMETASONE FURO-FORMOTEROL FUM 100-5 MCG/ACT IN AERO
2.0000 | INHALATION_SPRAY | Freq: Two times a day (BID) | RESPIRATORY_TRACT | Status: DC
  Administered 2015-09-26 – 2015-09-28 (×4): 2 via RESPIRATORY_TRACT
  Filled 2015-09-26: qty 8.8

## 2015-09-26 MED ORDER — TIOTROPIUM BROMIDE MONOHYDRATE 18 MCG IN CAPS
18.0000 ug | ORAL_CAPSULE | Freq: Every day | RESPIRATORY_TRACT | Status: DC
  Administered 2015-09-27 – 2015-09-28 (×2): 18 ug via RESPIRATORY_TRACT
  Filled 2015-09-26: qty 5

## 2015-09-26 MED ORDER — ACETAMINOPHEN 650 MG RE SUPP: 650.0000 mg | Freq: Four times a day (QID) | RECTAL | Status: DC | PRN

## 2015-09-26 MED ORDER — ASPIRIN EC 81 MG PO TBEC
81.0000 mg | DELAYED_RELEASE_TABLET | Freq: Every day | ORAL | Status: DC
  Administered 2015-09-27 – 2015-09-28 (×2): 81 mg via ORAL
  Filled 2015-09-26 (×2): qty 1

## 2015-09-26 MED ORDER — SODIUM CHLORIDE 0.9 % IV BOLUS (SEPSIS)
500.0000 mL | Freq: Once | INTRAVENOUS | Status: AC
  Administered 2015-09-26: 500 mL via INTRAVENOUS

## 2015-09-26 MED ORDER — ACETAMINOPHEN 325 MG PO TABS
650.0000 mg | ORAL_TABLET | Freq: Four times a day (QID) | ORAL | Status: DC | PRN
Start: 2015-09-26 — End: 2015-09-28

## 2015-09-26 MED ORDER — LATANOPROST 0.005 % OP SOLN
1.0000 [drp] | Freq: Every day | OPHTHALMIC | Status: DC
  Administered 2015-09-26 – 2015-09-27 (×2): 1 [drp] via OPHTHALMIC
  Filled 2015-09-26: qty 2.5

## 2015-09-26 MED ORDER — ONDANSETRON HCL 4 MG/2ML IJ SOLN: 4.0000 mg | Freq: Four times a day (QID) | INTRAMUSCULAR | Status: DC | PRN

## 2015-09-26 MED ORDER — INSULIN ASPART 100 UNIT/ML ~~LOC~~ SOLN
0.0000 [IU] | Freq: Three times a day (TID) | SUBCUTANEOUS | Status: DC
  Administered 2015-09-27: 2 [IU] via SUBCUTANEOUS
  Administered 2015-09-28: 1 [IU] via SUBCUTANEOUS
  Filled 2015-09-26: qty 1
  Filled 2015-09-26: qty 2

## 2015-09-26 MED ORDER — ONDANSETRON HCL 4 MG PO TABS: 4.0000 mg | ORAL_TABLET | Freq: Four times a day (QID) | ORAL | Status: DC | PRN

## 2015-09-26 MED ORDER — INSULIN ASPART 100 UNIT/ML ~~LOC~~ SOLN
3.0000 [IU] | Freq: Three times a day (TID) | SUBCUTANEOUS | Status: DC
  Administered 2015-09-27 – 2015-09-28 (×3): 3 [IU] via SUBCUTANEOUS
  Filled 2015-09-26 (×4): qty 3

## 2015-09-26 MED ORDER — SODIUM CHLORIDE 0.9 % IV SOLN
INTRAVENOUS | Status: DC
  Administered 2015-09-26: 1000 mL via INTRAVENOUS
  Administered 2015-09-26 – 2015-09-28 (×3): via INTRAVENOUS

## 2015-09-26 MED ORDER — SODIUM CHLORIDE 0.9% FLUSH
3.0000 mL | Freq: Two times a day (BID) | INTRAVENOUS | Status: DC
  Administered 2015-09-27: 3 mL via INTRAVENOUS

## 2015-09-26 MED ORDER — HEPARIN SODIUM (PORCINE) 5000 UNIT/ML IJ SOLN
5000.0000 [IU] | Freq: Three times a day (TID) | INTRAMUSCULAR | Status: DC
  Administered 2015-09-26 – 2015-09-28 (×5): 5000 [IU] via SUBCUTANEOUS
  Filled 2015-09-26 (×5): qty 1

## 2015-09-26 NOTE — ED Notes (Signed)
Patient presents to the ED with hypotension and abnormal lab values.  Patient states he was seen by PCP yesterday and his blood pressure was in the 80s systolic.  Patient was called by Dr. Juliann Paresallwood and [facility]  Patient had his medications changed last week to attempt to get rid of excess fluid.

## 2015-09-26 NOTE — ED Notes (Signed)
Attempted to call report. ICU RN asked if she could call back for report.

## 2015-09-26 NOTE — ED Notes (Signed)
1255 Kinner MD aware of critical B/P. Orders rcvd.

## 2015-09-26 NOTE — H&P (Signed)
[facility] [place] [facility]   PATIENT NAME: Janora NorlanderClyde Simonin    MR#:  [id number]  DATE OF BIRTH:  1955/10/25  DATE OF ADMISSION:  09/26/2015  PRIMARY CARE PHYSICIAN: Abran RichardBAUCOM, JENNY B, PA-C   REQUESTING/REFERRING PHYSICIAN:   CHIEF COMPLAINT:   Chief Complaint  Patient presents with  . Hypotension  . Abnormal Lab    HISTORY OF PRESENT ILLNESS: Janora NorlanderClyde Pendry  is a 60 y.o. male with a known history of Chronic respiratory failure, on 2 L of oxygen through nasal cannula 24 7, since last admission in June 2017, CHF, COPD, suspected obstructive sleep apnea, hyperlipidemia, obesity, pulmonary hypertension, who presents to the hospital with complaints of shortness of breath, hypotension. Apparently patient was admitted at the beginning of June for mild CHF exacerbation, COPD, he was given Lasix, however, he is lower. Exam of the swelling did not improve significantly, he was seen by Dr. Juliann Paresallwood about a week ago and his Lasix was changed to torsemide. Over the past couple of days. Patient has noticed that his urinary output has decreased and he became weaker. Today he was seen by his primary care physician, his blood pressure was checked and this was found to be low 70s systolic. On arrival to emergency room, his systolic blood pressure was as low as 60/50, IV fluids were given. His blood pressure somewhat improved. His labs revealed markedly elevation of creatinine to 6.06, also hyponatremia, acidosis. Hospitalist services were contacted for admission  PAST MEDICAL HISTORY:   Past Medical History  Diagnosis Date  . Mixed hyperlipidemia   . Hypertension   . Nonrheumatic mitral valve disorder   . Osteoarthritis of knee   . Chronic gout   . Colon polyp   . Glaucoma   . Obesity   . Shortness of breath   . CHF (congestive heart failure) (HCC)   . Bilateral lower extremity edema   . Obstructive sleep apnea   . Heart murmur   . Smoking   . Mild pulmonary hypertension  (HCC)     PAST SURGICAL HISTORY: Past Surgical History  Procedure Laterality Date  . Cardiac catheterization N/A 06/24/2015    Procedure: Right/Left Heart Cath and Coronary Angiography;  Surgeon: Alwyn Peawayne D Callwood, MD;  Location: [facility] INVASIVE CV LAB;  Service: Cardiovascular;  Laterality: N/A;  . Cervical fusion      SOCIAL HISTORY:  Social History  Substance Use Topics  . Smoking status: Current Every Day Smoker -- 0.25 packs/day for 40 years    Types: Cigarettes  . Smokeless tobacco: Not on file  . Alcohol Use: 1.8 oz/week    3 Shots of liquor per week    FAMILY HISTORY:  Family History  Problem Relation Age of Onset  . Arthritis    . Heart attack    . Multiple sclerosis      DRUG ALLERGIES: No Known Allergies  Review of Systems  Constitutional: Positive for weight loss and malaise/fatigue. Negative for fever and chills.  HENT: Negative for congestion.   Eyes: Positive for blurred vision. Negative for double vision.  Respiratory: Positive for shortness of breath. Negative for cough, sputum production and wheezing.   Cardiovascular: Positive for leg swelling. Negative for chest pain, palpitations, orthopnea and PND.  Gastrointestinal: Negative for nausea, vomiting, abdominal pain, diarrhea, constipation, blood in stool and melena.  Genitourinary: Negative for dysuria, urgency, frequency and hematuria.  Musculoskeletal: Negative for falls.  Skin: Negative for rash.  Neurological: Positive for weakness. Negative for dizziness.  Psychiatric/Behavioral: Negative for depression and memory loss. The patient is not nervous/anxious.     MEDICATIONS AT HOME:  Prior to Admission medications   Medication Sig Start Date End Date Taking? Authorizing Provider  allopurinol (ZYLOPRIM) 300 MG tablet Take 300 mg by mouth at bedtime.   Yes Historical Provider, MD  aspirin EC 81 MG tablet Take 81 mg by mouth daily.   Yes Historical Provider, MD  atorvastatin (LIPITOR) 40 MG tablet Take  40 mg by mouth daily.   Yes Historical Provider, MD  benazepril (LOTENSIN) 40 MG tablet Take 40 mg by mouth daily.   Yes Historical Provider, MD  carvedilol (COREG) 6.25 MG tablet Take 6.25 mg by mouth 2 (two) times daily with a meal.   Yes Historical Provider, MD  mometasone-formoterol (DULERA) 100-5 MCG/ACT AERO Inhale 2 puffs into the lungs 2 (two) times daily. 09/04/15  Yes Wyatt Hasteavid K Hower, MD  naproxen (NAPROSYN) 250 MG tablet Take 250 mg by mouth 2 (two) times daily with a meal.    Yes Historical Provider, MD  sulfamethoxazole-trimethoprim (BACTRIM DS,SEPTRA DS) 800-160 MG tablet Take 1 tablet by mouth 2 (two) times daily. 09/17/15 09/27/15 Yes Historical Provider, MD  tiotropium (SPIRIVA) 18 MCG inhalation capsule Place 1 capsule (18 mcg total) into inhaler and inhale daily. 09/04/15  Yes Wyatt Hasteavid K Hower, MD  torsemide (DEMADEX) 20 MG tablet Take 40 mg by mouth 2 (two) times daily.   Yes Historical Provider, MD  Travoprost, BAK Free, (TRAVATAN) 0.004 % SOLN ophthalmic solution Place 1 drop into both eyes at bedtime.   Yes Historical Provider, MD      PHYSICAL EXAMINATION:   VITAL SIGNS: Blood pressure 80/46, pulse 72, temperature 98 F (36.7 C), temperature source Oral, resp. rate 15, height 5\' 11"  (1.803 m), weight 122.018 kg (269 lb), SpO2 89 %.  GENERAL:  60 y.o.-year-old patient lying in the bed with no acute distress, Comfortable on the stretcher, although develops shortness of breath with prolonged talk and movements EYES: Pupils equal, round, reactive to light and accommodation. No scleral icterus. Extraocular muscles intact.  HEENT: Head atraumatic, normocephalic. Oropharynx and nasopharynx clear.  NECK:  Supple, no jugular venous distention. No thyroid enlargement, no tenderness.  LUNGS: Diminished breath sounds bilaterally, no wheezing, rales,rhonchi or crepitation. No use of accessory muscles of respiration, unless on exertion and with speech.  CARDIOVASCULAR: S1, S2 distant. No murmurs,  rubs, or gallops.  ABDOMEN: Soft, nontender, nondistended. Bowel sounds present. No organomegaly or mass.  EXTREMITIES: Trace pedal edema, no cyanosis, or clubbing.  NEUROLOGIC: Cranial nerves II through XII are intact. Muscle strength 5/5 in all extremities. Sensation intact. Gait not checked.  PSYCHIATRIC: The patient is alert and oriented x 3.  SKIN: No obvious rash, lesion, or ulcer.   LABORATORY PANEL:   CBC  Recent Labs Lab 09/26/15 1214  WBC 5.5  HGB 13.9  HCT 41.1  PLT 200  MCV 96.2  MCH 32.5  MCHC 33.8  RDW 13.6   ------------------------------------------------------------------------------------------------------------------  Chemistries   Recent Labs Lab 09/26/15 1214  NA 133*  K 4.3  CL 94*  CO2 23  GLUCOSE 133*  BUN 87*  CREATININE 6.06*  CALCIUM 8.5*   ------------------------------------------------------------------------------------------------------------------  Cardiac Enzymes  Recent Labs Lab 09/26/15 1214  TROPONINI <0.03   ------------------------------------------------------------------------------------------------------------------  RADIOLOGY: No results found.  EKG: Orders placed or performed during the hospital encounter of 09/26/15  . ED EKG  . ED EKG   EKG in the emergency room revealed normal sinus rhythm  at 76 bpm, right bundle branch block, T-wave abnormality consider anterolateral ischemia per EKG criteria, nonspecific ST-T changes  IMPRESSION AND PLAN:  Active Problems:   Acute renal failure (ARF) (HCC) #1. Acute renal failure, likely due to diuretics, Naprosyn and Lotensin, suspend all these medications, initiate IV fluids, get kidney ultrasound, nephrology consultation, follow ins and outs closely, get urinalysis to rule out urinary tract infection #2. Hyponatremia, follow-up with IV fluid administration, holding the diuretics #3. Diabetes mellitus type 2 with hemoglobin A1c 6.5, 09/04/2015, initiate sliding scale  insulin #4. Acidosis, as based by anion Gap, follow-up with IV fluid administration #5 chronic respiratory failure, continue oxygen therapy, keeping pulse oximetry at around 90-94% and above #6. Hypotension, holding all blood pressure medications, diuretics, continue IV fluids, follow ins and outs  All the records are reviewed and case discussed with ED provider. Management plans discussed with the patient, family and they are in agreement.  CODE STATUS: Code Status History    Date Active Date Inactive Code Status Order ID Comments User Context   09/02/2015  1:02 AM 09/04/2015  8:24 PM Full Code [id number]  Oralia Manis, MD Inpatient       TOTAL Critical care TIME TAKING CARE OF THIS PATIENT: 60 minutes.  Discussed with Dr. Lorelee Cover M.D on 09/26/2015 at 2:28 PM  Between 7am to 6pm - Pager - [phone] After 6pm go to [email or web address] - password [email or web address] [facility]  [facility]  Office  [phone]  CC: Primary care physician; Abran Richard, PA-C

## 2015-09-26 NOTE — Progress Notes (Signed)
A&Ox4.  Blood pressure stable.  Tolerating diet and patient voided in urinal.  Continue to monitor blood pressure and strict I&O.

## 2015-09-26 NOTE — ED Provider Notes (Signed)
[facility] Emergency Department Provider Note  ____________________________________________    I have reviewed the triage vital signs and the nursing notes.   HISTORY  Chief Complaint Hypotension and Abnormal Lab    HPI Aaron Doyle is a [age] y.o. male who presents with low blood pressure and abnormal lab values. Patient reports he saw his primary care provider for low blood pressure, they were concerned that he was overly diuresedbecause he is originally put on torsemide. He denies chest pain. He denies dizziness. Was in the hospital earlier this month for shortness of breath. He sees Dr. Juliann Paresallwood of cardiology     Past Medical History  Diagnosis Date  . Mixed hyperlipidemia   . Hypertension   . Nonrheumatic mitral valve disorder   . Osteoarthritis of knee   . Chronic gout   . Colon polyp   . Glaucoma   . Obesity   . Shortness of breath   . CHF (congestive heart failure) (HCC)   . Bilateral lower extremity edema   . Obstructive sleep apnea   . Heart murmur   . Smoking   . Mild pulmonary hypertension [facility](HCC)     Patient Active Problem List   Diagnosis Date Noted  . COPD exacerbation (HCC) 09/01/2015  . HTN (hypertension) 09/01/2015  . CHF (congestive heart failure) (HCC) 09/01/2015  . OSA (obstructive sleep apnea) 09/01/2015    Past Surgical History  Procedure Laterality Date  . Cardiac catheterization N/A 06/24/2015    Procedure: Right/Left Heart Cath and Coronary Angiography;  Surgeon: Alwyn Peawayne D Callwood, MD;  Location: [facility] INVASIVE CV LAB;  Service: Cardiovascular;  Laterality: N/A;  . Cervical fusion      Current Outpatient Rx  Name  Route  Sig  Dispense  Refill  . allopurinol (ZYLOPRIM) 300 MG tablet   Oral   Take 300 mg by mouth at bedtime.         Marland Kitchen. aspirin 81 MG tablet   Oral   Take 81 mg by mouth daily.         Marland Kitchen. atorvastatin (LIPITOR) 40 MG tablet   Oral   Take 40 mg by mouth daily.         Marland Kitchen. azithromycin  (ZITHROMAX) 250 MG tablet   Oral   Take 1 tablet (250 mg total) by mouth daily.   5 tablet   0   . benazepril (LOTENSIN) 40 MG tablet   Oral   Take 40 mg by mouth daily.         . carvedilol (COREG) 6.25 MG tablet   Oral   Take 6.25 mg by mouth 2 (two) times daily with a meal.         . furosemide (LASIX) 40 MG tablet   Oral   Take 40 mg by mouth 2 (two) times daily.          . mometasone-formoterol (DULERA) 100-5 MCG/ACT AERO   Inhalation   Inhale 2 puffs into the lungs 2 (two) times daily.   13 g   0   . naproxen (NAPROSYN) 250 MG tablet   Oral   Take 250 mg by mouth 2 (two) times daily with a meal. Reported on 06/24/2015         . predniSONE (STERAPRED UNI-PAK 21 TAB) 10 MG (21) TBPK tablet   Oral   Take 1 tablet (10 mg total) by mouth daily. 40mg  oral 1 day, 20mg  oral 2 days, 10mg  oral 2 days then stop   10  tablet   0   . tiotropium (SPIRIVA) 18 MCG inhalation capsule   Inhalation   Place 1 capsule (18 mcg total) into inhaler and inhale daily.   30 capsule   12   . Travoprost, BAK Free, (TRAVATAN) 0.004 % SOLN ophthalmic solution   Both Eyes   Place 1 drop into both eyes at bedtime.           Allergies Review of patient's allergies indicates no known allergies.  Family History  Problem Relation Age of Onset  . Arthritis    . Heart attack    . Multiple sclerosis      Social History Social History  Substance Use Topics  . Smoking status: Current Every Day Smoker -- 0.25 packs/day for 40 years    Types: Cigarettes  . Smokeless tobacco: Not on file  . Alcohol Use: 1.8 oz/week    3 Shots of liquor per week    Review of Systems  Constitutional: Negative for fever. Eyes: Negative for redness ENT: Negative for sore throat Cardiovascular: Negative for chest pain Respiratory: Negative for shortness of breath. Gastrointestinal: Negative for abdominal pain Genitourinary: Negative for dysuria. Musculoskeletal: Negative for back pain. Skin:  Negative for rash. Neurological: Negative for focal weakness Psychiatric: no anxiety    ____________________________________________   PHYSICAL EXAM:  VITAL SIGNS: ED Triage Vitals  Enc Vitals Group     BP 09/26/15 1155 88/56 mmHg     Pulse Rate 09/26/15 1155 47     Resp 09/26/15 1155 18     Temp 09/26/15 1155 98 F (36.7 C)     Temp Source 09/26/15 1155 Oral     SpO2 09/26/15 1155 92 %     Weight 09/26/15 1155 269 lb (122.018 kg)     Height 09/26/15 1155 5\' 11"  (1.803 m)     Head Cir --      Peak Flow --      Pain Score 09/26/15 1156 0     Pain Loc --      Pain Edu? --      Excl. in GC? --      Constitutional: Alert and oriented. Well appearing and in no distress.  Eyes: Conjunctivae are normal. No erythema or injection ENT   Head: Normocephalic and atraumatic.   Mouth/Throat: Mucous membranes are moist. Cardiovascular: Normal rate, regular rhythm. Normal and symmetric distal pulses are present in the upper extremities.  Respiratory: Normal respiratory effort without tachypnea nor retractions. Breath sounds are clear and equal bilaterally.  Gastrointestinal: Soft and non-tender in all quadrants. No distention. There is no CVA tenderness. Genitourinary: deferred Musculoskeletal: Nontender with normal range of motion in all extremities. No lower extremity tenderness nor edema. Neurologic:  Normal speech and language. No gross focal neurologic deficits are appreciated. Skin:  Skin is warm, dry and intact. No rash noted. Psychiatric: Mood and affect are normal. Patient exhibits appropriate insight and judgment.  ____________________________________________    LABS (pertinent positives/negatives)  Labs Reviewed  CBC - Abnormal; Notable for the following:    RBC 4.28 (*)    All other components within normal limits  BASIC METABOLIC PANEL  URINALYSIS COMPLETEWITH MICROSCOPIC ([facility] ONLY)  CBG MONITORING, ED     ____________________________________________   EKG  ED ECG REPORT I, Jene EveryKINNER, Kanasia Gayman, the attending physician, personally viewed and interpreted this ECG.  Date: 09/26/2015 EKG Time: 12:10 PM Rate: 76 Rhythm: normal sinus rhythm QRS Axis: normal Intervals: normal ST/T Wave abnormalities: normal Conduction Disturbances: Right bundle branch block  ____________________________________________    RADIOLOGY  none  ____________________________________________   PROCEDURES  Procedure(s) performed: none  Critical Care performed: none  ____________________________________________   INITIAL IMPRESSION / ASSESSMENT AND PLAN / ED COURSE  Pertinent labs & imaging results that were available during my care of the patient were reviewed by me and considered in my medical decision making (see chart for details).  Patient presents with hypotension and bradycardia. We will check labs for suspected renal failure  ----------------------------------------- 1:35 PM on 09/26/2015 -----------------------------------------  Creatinine is 6, BUN is 87 consistent with renal failure likely secondary to overdiuresis. I will admit the patient to the hospitalist service  ____________________________________________   FINAL CLINICAL IMPRESSION(S) / ED DIAGNOSES  Final diagnoses:  Acute renal failure, unspecified acute renal failure type (HCC)  Orthostatic hypotension          Jene Every, MD 09/26/15 1337

## 2015-09-27 LAB — CBC
HEMATOCRIT: 38.8 % — AB (ref 40.0–52.0)
HEMOGLOBIN: 13.1 g/dL (ref 13.0–18.0)
MCH: 32.8 pg (ref 26.0–34.0)
MCHC: 33.8 g/dL (ref 32.0–36.0)
MCV: 96.8 fL (ref 80.0–100.0)
Platelets: 186 10*3/uL (ref 150–440)
RBC: 4.01 MIL/uL — AB (ref 4.40–5.90)
RDW: 13.2 % (ref 11.5–14.5)
WBC: 4.6 10*3/uL (ref 3.8–10.6)

## 2015-09-27 LAB — URINALYSIS COMPLETE WITH MICROSCOPIC (ARMC ONLY)
BILIRUBIN URINE: NEGATIVE
Bacteria, UA: NONE SEEN
GLUCOSE, UA: 50 mg/dL — AB
KETONES UR: NEGATIVE mg/dL
Leukocytes, UA: NEGATIVE
Nitrite: NEGATIVE
Protein, ur: NEGATIVE mg/dL
SQUAMOUS EPITHELIAL / LPF: NONE SEEN
Specific Gravity, Urine: 1.011 (ref 1.005–1.030)
pH: 6 (ref 5.0–8.0)

## 2015-09-27 LAB — BASIC METABOLIC PANEL
ANION GAP: 6 (ref 5–15)
BUN: 68 mg/dL — ABNORMAL HIGH (ref 6–20)
CHLORIDE: 100 mmol/L — AB (ref 101–111)
CO2: 29 mmol/L (ref 22–32)
Calcium: 8.1 mg/dL — ABNORMAL LOW (ref 8.9–10.3)
Creatinine, Ser: 3.67 mg/dL — ABNORMAL HIGH (ref 0.61–1.24)
GFR calc non Af Amer: 16 mL/min — ABNORMAL LOW (ref >60)
GFR, EST AFRICAN AMERICAN: 19 mL/min — AB (ref >60)
Glucose, Bld: 99 mg/dL (ref 65–99)
POTASSIUM: 4.7 mmol/L (ref 3.5–5.1)
Sodium: 135 mmol/L (ref 135–145)

## 2015-09-27 LAB — GLUCOSE, CAPILLARY
GLUCOSE-CAPILLARY: 90 mg/dL (ref 65–99)
GLUCOSE-CAPILLARY: 172 mg/dL — AB (ref 65–99)
GLUCOSE-CAPILLARY: 109 mg/dL — AB (ref 65–99)
Glucose-Capillary: 128 mg/dL — ABNORMAL HIGH (ref 65–99)

## 2015-09-27 NOTE — Consult Note (Signed)
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  Patient states that last week, he was noticing increased lower extremity edema therefore Lasix was changed to torsemide along with metolazone for 3 days.  Patient appears to have had massive diuresis.  His blood pressure then start to drop in the 70s.  Labs show increased creatinine.  Patient was admitted for further evaluation and management Baseline creatinine is 0.89 from September 02, 2015 Admission creatinine is 6.06 Today's creatinine has improved to 3.67 with IV hydration   Medications: Outpatient medications: Prescriptions prior to admission  Medication Sig Dispense Refill Last Dose  . allopurinol (ZYLOPRIM) 300 MG tablet Take 300 mg by mouth at bedtime.   09/25/2015 at Unknown time  . aspirin EC 81 MG tablet Take 81 mg by mouth daily.   09/26/2015 at 0800  . atorvastatin (LIPITOR) 40 MG tablet Take 40 mg by mouth daily.   09/26/2015 at Unknown time  . benazepril (LOTENSIN) 40 MG tablet Take 40 mg by mouth daily.   09/26/2015 at Unknown time  . carvedilol (COREG) 6.25 MG tablet Take 6.25 mg by mouth 2 (two) times daily with a meal.   09/25/2015 at 0800  . mometasone-formoterol (DULERA) 100-5 MCG/ACT AERO Inhale 2 puffs into the lungs 2 (two) times daily. 13 g 0 09/26/2015 at Unknown time  . naproxen (NAPROSYN) 250 MG tablet Take 250 mg by mouth 2 (two) times daily with a meal.    09/26/2015 at 0800   . sulfamethoxazole-trimethoprim (BACTRIM DS,SEPTRA DS) 800-160 MG tablet Take 1 tablet by mouth 2 (two) times daily.   09/25/2015 at Unknown time  . tiotropium (SPIRIVA) 18 MCG inhalation capsule Place 1 capsule (18 mcg total) into inhaler and inhale daily. 30 capsule 12 09/26/2015 at Unknown time  . torsemide (DEMADEX) 20 MG tablet Take 40 mg by mouth 2 (two) times daily.   09/26/2015 at Unknown time  . Travoprost, BAK Free, (TRAVATAN) 0.004 % SOLN ophthalmic solution Place 1 drop into both eyes at bedtime.   09/25/2015 at Unknown time    Current medications: Current Facility-Administered Medications  Medication Dose Route Frequency Provider Last Rate Last Dose  . 0.9 %  sodium chloride infusion   Intravenous Continuous Milagros LollSrikar Sudini, MD 50 mL/hr at 09/27/15 0830    . acetaminophen (TYLENOL) tablet 650 mg  650 mg Oral Q6H PRN Katharina Caperima Vaickute, MD       Or  . acetaminophen (TYLENOL) suppository 650 mg  650 mg Rectal Q6H PRN Katharina Caperima Vaickute, MD      . antiseptic oral rinse (CPC / CETYLPYRIDINIUM CHLORIDE 0.05%) solution 7 mL  7 mL Mouth Rinse BID Katharina Caperima Vaickute, MD  7 mL at 09/26/15 2200  . aspirin EC tablet 81 mg  81 mg Oral Daily Katharina Caper, MD      . heparin injection 5,000 Units  5,000 Units Subcutaneous Q8H Katharina Caper, MD   5,000 Units at 09/27/15 0537  . insulin aspart (novoLOG) injection 0-9 Units  0-9 Units Subcutaneous TID WC Katharina Caper, MD   0 Units at 09/26/15 1651  . insulin aspart (novoLOG) injection 3 Units  3 Units Subcutaneous TID WC Katharina Caper, MD   3 Units at 09/26/15 1805  . latanoprost (XALATAN) 0.005 % ophthalmic solution 1 drop  1 drop Both Eyes QHS Oralia Manis, MD   1 drop at 09/26/15 2233  . mometasone-formoterol (DULERA) 100-5 MCG/ACT inhaler 2 puff  2 puff Inhalation BID Katharina Caper, MD   2 puff at 09/26/15 2233  . ondansetron (ZOFRAN) tablet 4 mg  4 mg Oral Q6H PRN Katharina Caper, MD       Or  . ondansetron (ZOFRAN) injection 4 mg  4 mg Intravenous Q6H PRN Katharina Caper, MD      . pneumococcal 23 valent vaccine (PNU-IMMUNE) injection 0.5 mL  0.5 mL Intramuscular Tomorrow-1000 Katharina Caper, MD      . sodium chloride flush (NS) 0.9 % injection 3 mL  3 mL Intravenous Q12H Katharina Caper, MD   3 mL at 09/27/15 0535  . tiotropium (SPIRIVA) inhalation capsule 18 mcg  18 mcg Inhalation Daily Katharina Caper, MD          Allergies: No Known Allergies    Past Medical History: Past Medical History  Diagnosis Date  . Mixed hyperlipidemia   . Hypertension   . Nonrheumatic mitral valve disorder   . Osteoarthritis of knee   . Chronic gout   . Colon polyp   . Glaucoma   . Obesity   . Shortness of breath   . CHF (congestive heart failure) (HCC)   . Bilateral lower extremity edema   . Obstructive sleep apnea   . Heart murmur   . Smoking   . Mild pulmonary hypertension (HCC)      Past Surgical History: Past Surgical History  Procedure Laterality Date  . Cardiac catheterization N/A 06/24/2015    Procedure: Right/Left Heart Cath and Coronary Angiography;  Surgeon: Alwyn Pea, MD;  Location: [facility] INVASIVE CV LAB;  Service: Cardiovascular;  Laterality: N/A;  . Cervical fusion       Family History: Family History  Problem Relation Age of Onset  . Arthritis    . Heart attack    . Multiple sclerosis       Social History: Social History   Social History  . Marital Status: Single    Spouse Name: N/A  . Number of Children: N/A  . Years of Education: N/A   Occupational History  . Not on file.   Social History Main Topics  . Smoking status: Current Every Day Smoker -- 0.25 packs/day for 40 years    Types: Cigarettes  . Smokeless tobacco: Not on file  . Alcohol Use: 1.8 oz/week    3 Shots of liquor per week  . Drug Use: No  . Sexual Activity: Not on file   Other Topics Concern  . Not on file   Social History Narrative     Review of Systems: Gen: no fevers, chills, weight loss HEENT: no vision changes, hearing problems or  sore throat CV: no shortness of breath Resp: chronic pulmonary hypertension GI:no problems with nausea, vomiting of  appetite GU : no problems with voiding.  No blood in the urine MS: no acute complaints Derm:  no complaints Psych:no complaints Heme: no evidence Neuro: no complaints Endocrineno complaints  Vital Signs: Blood pressure 108/55, pulse 70, temperature 98.3 F (36.8 C), temperature source Oral, resp. rate 38, height 6' (1.829 m), weight 121.7 kg (268 lb 4.8 oz), SpO2 100 %.   Intake/Output Summary (Last 24 hours) at 09/27/15 0958 Last data filed at 09/27/15 0900  Gross per 24 hour  Intake   1355 ml  Output   2975 ml  Net  -1620 ml    Weight trends: Filed Weights   09/26/15 1155 09/26/15 1637 09/27/15 0500  Weight: 122.018 kg (269 lb) 121.9 kg (268 lb 11.9 oz) 121.7 kg (268 lb 4.8 oz)    Physical Exam: General:  no acute distress, lying in the bed  HEENT Anicteric, moist mucous membranes  Neck:  supple  Lungs: Normal breathing effort, clear to auscultation bilaterally  Heart::  regular, no rub or gallop  Abdomen: soft, nontender, nondistended  Extremities:  no peripheral edema  Neurologic: Alert, oriented  Skin: Chronic venous stasis changes over legs             Lab results: Basic Metabolic Panel:  Recent Labs Lab 09/26/15 1214 09/27/15 0558  NA 133* 135  K 4.3 4.7  CL 94* 100*  CO2 23 29  GLUCOSE 133* 99  BUN 87* 68*  CREATININE 6.06* 3.67*  CALCIUM 8.5* 8.1*  MG 2.2  --     Liver Function Tests: No results for input(s): AST, ALT, ALKPHOS, BILITOT, PROT, ALBUMIN in the last 168 hours. No results for input(s): LIPASE, AMYLASE in the last 168 hours. No results for input(s): AMMONIA in the last 168 hours.  CBC:  Recent Labs Lab 09/26/15 1214 09/27/15 0558  WBC 5.5 4.6  HGB 13.9 13.1  HCT 41.1 38.8*  MCV 96.2 96.8  PLT 200 186    Cardiac Enzymes:  Recent Labs Lab 09/26/15 1214  TROPONINI <0.03    BNP: Invalid input(s):  POCBNP  CBG:  Recent Labs Lab 09/26/15 1647 09/26/15 2130 09/27/15 0727  GLUCAP 88 107* 90    Microbiology: Recent Results (from the past 720 hour(s))  MRSA PCR Screening     Status: None   Collection Time: 09/26/15  4:47 PM  Result Value Ref Range Status   MRSA by PCR NEGATIVE NEGATIVE Final    Comment:        The GeneXpert MRSA Assay (FDA approved for NASAL specimens only), is one component of a comprehensive MRSA colonization surveillance program. It is not intended to diagnose MRSA infection nor to guide or monitor treatment for MRSA infections.      Coagulation Studies: No results for input(s): LABPROT, INR in the last 72 hours.  Urinalysis: No results for input(s): COLORURINE, LABSPEC, PHURINE, GLUCOSEU, HGBUR, BILIRUBINUR, KETONESUR, PROTEINUR, UROBILINOGEN, NITRITE, LEUKOCYTESUR in the last 72 hours.  Invalid input(s): APPERANCEUR      Imaging: Koreas Renal  09/26/2015  CLINICAL DATA:  Acute renal failure. EXAM: RENAL / URINARY TRACT ULTRASOUND COMPLETE COMPARISON:  CT 12/28/2014. FINDINGS: Right Kidney: Length: 10.7 cm. Echogenicity within normal limits. No hydronephrosis visualized. 2.6 x 2.9 x 2.6 cm simple cyst midportion right kidney. Left Kidney: Length: 12.2 cm. Echogenicity within normal limits. No mass or hydronephrosis visualized. Multiple echogenic foci are noted consistent with tiny nonobstructing renal calyceal stones and/or vascular calcifications. Bladder: Appears normal for degree of bladder distention. Exam limited by  patient's body habitus. IMPRESSION: 1. No acute abnormality.  No hydronephrosis or bladder distention. 2. 2.9 cm simple right renal cyst. Tiny echogenic foci noted within the left kidney consistent with tiny nonobstructing renal calyceal stones versus vascular calcification. 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   1.  Acute renal failure Likely secondary to overdiuresis, in setting of taking benazepril and Naproxyn possibly also leading to ATN Serum creatinine is improving with IV hydration Renal ultrasound is negative for obstruction Plan: Continue IV hydration Family with holding ACE inhibitor just yet Monitor electrolytes closely We'll follow

## 2015-09-27 NOTE — Progress Notes (Addendum)
Notified Dr. Anne HahnWillis of patients missed beats. Will continue to monitor, no new orders received.

## 2015-09-27 NOTE — Progress Notes (Signed)
[facility]   PATIENT NAME: Aaron NorlanderClyde Marcou    MR#:  [id number]  DATE OF BIRTH:  [birth year]  SUBJECTIVE:  CHIEF COMPLAINT:   Chief Complaint  Patient presents with  . Hypotension  . Abnormal Lab   Feels better. Blood pressure has improved. Good urine output. On 2 L oxygen.  REVIEW OF SYSTEMS:    Review of Systems  Constitutional: Positive for malaise/fatigue. Negative for fever and chills.  HENT: Negative for sore throat.   Eyes: Negative for blurred vision, double vision and pain.  Respiratory: Positive for shortness of breath. Negative for cough, hemoptysis and wheezing.   Cardiovascular: Negative for chest pain, palpitations, orthopnea and leg swelling.  Gastrointestinal: Negative for heartburn, nausea, vomiting, abdominal pain, diarrhea and constipation.  Genitourinary: Negative for dysuria and hematuria.  Musculoskeletal: Negative for back pain and joint pain.  Skin: Negative for rash.  Neurological: Positive for weakness. Negative for sensory change, speech change, focal weakness and headaches.  Endo/Heme/Allergies: Does not bruise/bleed easily.  Psychiatric/Behavioral: Negative for depression. The patient is not nervous/anxious.     DRUG ALLERGIES:  No Known Allergies  VITALS:  Blood pressure 122/90, pulse 83, temperature 98.3 F (36.8 C), temperature source Oral, resp. rate 19, height 6' (1.829 m), weight 121.7 kg (268 lb 4.8 oz), SpO2 93 %.  PHYSICAL EXAMINATION:   Physical Exam  GENERAL:  60 y.o.-year-old patient lying in the bed with no acute distress. Morbidly obese EYES: Pupils equal, round, reactive to light and accommodation. No scleral icterus. Extraocular muscles intact.  HEENT: Head atraumatic, normocephalic. Oropharynx and nasopharynx clear.  NECK:  Supple, no jugular venous distention. No thyroid enlargement, no tenderness.  LUNGS: Normal breath sounds bilaterally, no wheezing, rales, rhonchi. No use of  accessory muscles of respiration.  CARDIOVASCULAR: S1, S2 normal. No murmurs, rubs, or gallops.  ABDOMEN: Soft, nontender, nondistended. Bowel sounds present. No organomegaly or mass.  EXTREMITIES: No cyanosis, clubbing or edema b/l.    NEUROLOGIC: Cranial nerves II through XII are intact. No focal Motor or sensory deficits b/l.   PSYCHIATRIC: The patient is alert and oriented x 3.  SKIN: No obvious rash, lesion, or ulcer.   LABORATORY PANEL:   CBC  Recent Labs Lab 09/27/15 0558  WBC 4.6  HGB 13.1  HCT 38.8*  PLT 186   ------------------------------------------------------------------------------------------------------------------ Chemistries   Recent Labs Lab 09/26/15 1214 09/27/15 0558  NA 133* 135  K 4.3 4.7  CL 94* 100*  CO2 23 29  GLUCOSE 133* 99  BUN 87* 68*  CREATININE 6.06* 3.67*  CALCIUM 8.5* 8.1*  MG 2.2  --    ------------------------------------------------------------------------------------------------------------------  Cardiac Enzymes  Recent Labs Lab 09/26/15 1214  TROPONINI <0.03   ------------------------------------------------------------------------------------------------------------------  RADIOLOGY:  [place] Renal  09/26/2015  CLINICAL DATA:  Acute renal failure. EXAM: RENAL / URINARY TRACT ULTRASOUND COMPLETE COMPARISON:  CT 12/28/2014. FINDINGS: Right Kidney: Length: 10.7 cm. Echogenicity within normal limits. No hydronephrosis visualized. 2.6 x 2.9 x 2.6 cm simple cyst midportion right kidney. Left Kidney: Length: 12.2 cm. Echogenicity within normal limits. No mass or hydronephrosis visualized. Multiple echogenic foci are noted consistent with tiny nonobstructing renal calyceal stones and/or vascular calcifications. Bladder: Appears normal for degree of bladder distention. Exam limited by patient's body habitus. IMPRESSION: 1. No acute abnormality.  No hydronephrosis or bladder distention. 2. 2.9 cm simple right renal cyst. Tiny echogenic foci  noted within the left kidney consistent with tiny nonobstructing renal calyceal stones versus vascular calcification. Electronically  Signed   ByMaisie Fus: Thomas  Register   On: 09/26/2015 15:38     ASSESSMENT AND PLAN:   #. Acute kidney injury over CKD stage III - improving Due to diuresis and naproxen Hold diuretics Decrease IV fluids to 50 ML per hour. Monitor input and output. Repeat labs in the morning Ultrasound kidneys reviewed Discussed with Dr. Thedore MinsSingh of nephrology  #. Hypovolemic Hyponatremia Improved with IV fluids  #. Diabetes mellitus type 2 with hemoglobin A1c 6.5 Sliding scale insulin  # chronic respiratory failure, continue oxygen At 2 L/m Patient has appointment with pulmonary on Monday at 4 PM  #. Hypertension Medications on hold due to hypotension and renal failure.  # Chronic diastolic CHF No signs of fluid overload.  # DVT prophylaxis with heparin  All the records are reviewed and case discussed with Care Management/Social Workerr. Management plans discussed with the patient, family and they are in agreement.  CODE STATUS: FULL CODE  DVT Prophylaxis: SCDs  TOTAL TIME TAKING CARE OF THIS PATIENT: 35 minutes.   POSSIBLE D/C IN 1-2 DAYS, DEPENDING ON CLINICAL CONDITION.  Milagros LollSudini, Sahej Schrieber R M.D on 09/27/2015 at 10:27 AM  Between 7am to 6pm - Pager - [phone]  After 6pm go to [email or web address] - password [email or web address] [facility]  [facility]  Office  [phone]  CC: Primary care physician; Abran RichardBAUCOM, JENNY B, PA-C  Note: This dictation was prepared with Dragon dictation along with smaller phrase technology. Any transcriptional errors that result from this process are unintentional.

## 2015-09-27 NOTE — Progress Notes (Signed)
Mr. Aaron Doyle is alert and oriented. No c/o pain. VSS. Take meds whole without difficulty. On 2L nasal cannula. Ordered to transfer to telemetry. Report given to Laurys Stationammy, Charity fundraiserN.

## 2015-09-28 LAB — BASIC METABOLIC PANEL
ANION GAP: 4 — AB (ref 5–15)
ANION GAP: 8 (ref 5–15)
BUN: 36 mg/dL — ABNORMAL HIGH (ref 6–20)
BUN: 30 mg/dL — AB (ref 6–20)
CALCIUM: 8.7 mg/dL — AB (ref 8.9–10.3)
CHLORIDE: 102 mmol/L (ref 101–111)
CHLORIDE: 96 mmol/L — AB (ref 101–111)
CO2: 31 mmol/L (ref 22–32)
CO2: 33 mmol/L — ABNORMAL HIGH (ref 22–32)
CREATININE: 1.76 mg/dL — AB (ref 0.61–1.24)
Calcium: 8.8 mg/dL — ABNORMAL LOW (ref 8.9–10.3)
Creatinine, Ser: 1.52 mg/dL — ABNORMAL HIGH (ref 0.61–1.24)
GFR calc Af Amer: 55 mL/min — ABNORMAL LOW (ref >60)
GFR calc non Af Amer: 40 mL/min — ABNORMAL LOW (ref >60)
GFR, EST AFRICAN AMERICAN: 46 mL/min — AB (ref >60)
GFR, EST NON AFRICAN AMERICAN: 48 mL/min — AB (ref >60)
GLUCOSE: 125 mg/dL — AB (ref 65–99)
Glucose, Bld: 111 mg/dL — ABNORMAL HIGH (ref 65–99)
POTASSIUM: 4.8 mmol/L (ref 3.5–5.1)
Potassium: 6.2 mmol/L — ABNORMAL HIGH (ref 3.5–5.1)
SODIUM: 137 mmol/L (ref 135–145)
Sodium: 137 mmol/L (ref 135–145)

## 2015-09-28 LAB — GLUCOSE, CAPILLARY
GLUCOSE-CAPILLARY: 115 mg/dL — AB (ref 65–99)
Glucose-Capillary: 134 mg/dL — ABNORMAL HIGH (ref 65–99)

## 2015-09-28 MED ORDER — FUROSEMIDE 10 MG/ML IJ SOLN
40.0000 mg | Freq: Once | INTRAMUSCULAR | Status: AC
  Administered 2015-09-28: 40 mg via INTRAVENOUS
  Filled 2015-09-28: qty 4

## 2015-09-28 MED ORDER — DEXTROSE 50 % IV SOLN
25.0000 mL | Freq: Once | INTRAVENOUS | Status: AC
  Administered 2015-09-28: 25 mL via INTRAVENOUS
  Filled 2015-09-28: qty 50

## 2015-09-28 MED ORDER — SODIUM POLYSTYRENE SULFONATE 15 GM/60ML PO SUSP
30.0000 g | Freq: Once | ORAL | Status: AC
  Administered 2015-09-28: 30 g via ORAL
  Filled 2015-09-28: qty 120

## 2015-09-28 MED ORDER — TORSEMIDE 20 MG PO TABS: 20.0000 mg | ORAL_TABLET | Freq: Every day | ORAL | Status: DC

## 2015-09-28 MED ORDER — INSULIN REGULAR HUMAN 100 UNIT/ML IJ SOLN
5.0000 [IU] | Freq: Once | INTRAMUSCULAR | Status: AC
  Administered 2015-09-28: 5 [IU] via INTRAVENOUS
  Filled 2015-09-28: qty 0.05

## 2015-09-28 NOTE — Progress Notes (Signed)
Subjective:  Patient is doing well Ambulatory in the room S Cr has improved signficantly Potassium is high this AM UOP > 4 L   Objective:  Vital signs in last 24 hours:  Temp:  [97.8 F (36.6 C)-98.4 F (36.9 C)] 97.8 F (36.6 C) (07/02 0420) Pulse Rate:  [70-75] 70 (07/02 0420) Resp:  [16-22] 16 (07/02 0420) BP: (114-141)/(57-65) 141/65 mmHg (07/02 0420) SpO2:  [85 %-98 %] 97 % (07/02 0420) Weight:  [120.3 kg (265 lb 3.4 oz)] 120.3 kg (265 lb 3.4 oz) (07/02 0420)  Weight change: -1.718 kg (-3 lb 12.6 oz) Filed Weights   09/26/15 1637 09/27/15 0500 09/28/15 0420  Weight: 121.9 kg (268 lb 11.9 oz) 121.7 kg (268 lb 4.8 oz) 120.3 kg (265 lb 3.4 oz)    Intake/Output:    Intake/Output Summary (Last 24 hours) at 09/28/15 1030 Last data filed at 09/28/15 0924  Gross per 24 hour  Intake    480 ml  Output   4575 ml  Net  -4095 ml     Physical Exam: General: NAD,  HEENT anicteric  Neck supple  Pulm/lungs Clear, normal effort  CVS/Heart No rub/gallop  Abdomen:  Soft, NT  Extremities: Trace edema  Neurologic: Alert, oreinted  Skin: No rashes          Basic Metabolic Panel:   Recent Labs Lab 09/26/15 1214 09/27/15 0558 09/28/15 0433  NA 133* 135 137  K 4.3 4.7 6.2*  CL 94* 100* 102  CO2 23 29 31   GLUCOSE 133* 99 125*  BUN 87* 68* 36*  CREATININE 6.06* 3.67* 1.76*  CALCIUM 8.5* 8.1* 8.7*  MG 2.2  --   --      CBC:  Recent Labs Lab 09/26/15 1214 09/27/15 0558  WBC 5.5 4.6  HGB 13.9 13.1  HCT 41.1 38.8*  MCV 96.2 96.8  PLT 200 186      Microbiology:  Recent Results (from the past 720 hour(s))  MRSA PCR Screening     Status: None   Collection Time: 09/26/15  4:47 PM  Result Value Ref Range Status   MRSA by PCR NEGATIVE NEGATIVE Final    Comment:        The GeneXpert MRSA Assay (FDA approved for NASAL specimens only), is one component of a comprehensive MRSA colonization surveillance program. It is not intended to diagnose  MRSA infection nor to guide or monitor treatment for MRSA infections.     Coagulation Studies: No results for input(s): LABPROT, INR in the last 72 hours.  Urinalysis:  Recent Labs  09/27/15 0940  COLORURINE STRAW*  LABSPEC 1.011  PHURINE 6.0  GLUCOSEU 50*  HGBUR 1+*  BILIRUBINUR NEGATIVE  KETONESUR NEGATIVE  PROTEINUR NEGATIVE  NITRITE NEGATIVE  LEUKOCYTESUR NEGATIVE      Imaging: [place]as Renal  09/26/2015  CLINICAL DATA:  Acute renal failure. EXAM: RENAL / URINARY TRACT ULTRASOUND COMPLETE COMPARISON:  CT 12/28/2014. FINDINGS: Right Kidney: Length: 10.7 cm. Echogenicity within normal limits. No hydronephrosis visualized. 2.6 x 2.9 x 2.6 cm simple cyst midportion right kidney. Left Kidney: Length: 12.2 cm. Echogenicity within normal limits. No mass or hydronephrosis visualized. Multiple echogenic foci are noted consistent with tiny nonobstructing renal calyceal stones and/or vascular calcifications. Bladder: Appears normal for degree of bladder distention. Exam limited by patient's body habitus. IMPRESSION: 1. No acute abnormality.  No hydronephrosis or bladder distention. 2. 2.9 cm simple right renal cyst. Tiny echogenic foci noted within the left kidney consistent with tiny nonobstructing renal calyceal stones  versus vascular calcification. Electronically Signed   By: Maisie Fushomas  Register   On: 09/26/2015 15:38     Medications:   . sodium chloride 50 mL/hr at 09/28/15 0240   . antiseptic oral rinse  7 mL Mouth Rinse BID  . aspirin EC  81 mg Oral Daily  . heparin  5,000 Units Subcutaneous Q8H  . insulin aspart  0-9 Units Subcutaneous TID WC  . insulin aspart  3 Units Subcutaneous TID WC  . latanoprost  1 drop Both Eyes QHS  . mometasone-formoterol  2 puff Inhalation BID  . sodium chloride flush  3 mL Intravenous Q12H  . 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   1. Acute renal failure (Baseline creatinine is 0.89 from September 02, 2015) Likely secondary to overdiuresis, in setting of taking benazepril and Naproxyn possibly leading to ATN Serum creatinine is improving with IV hydration Renal ultrasound is negative for obstruction Plan: May restart ACE-i once potassium is normal - oral intake is adequate, may d/c iv fluids - recommend schedule low dose torsemide 20 mg once daily  2. Hyperkalemia Unclear cause ? Hemolyzed specimen Repeat level pending    LOS: 2 Meriah Shands 7/2/201710:30 AM

## 2015-09-28 NOTE — Progress Notes (Signed)
Notified MD Sheryle Hailiamond of elevated potassium 6.2; orders placed per MD and acknowledged. Patient showing no signs/symptoms of distress. Nursing staff will continue to monitor. Lamonte RicherKara A Shykeem Resurreccion, RN

## 2015-09-28 NOTE — Discharge Instructions (Signed)
°  DIET:  °Cardiac diet ° °DISCHARGE CONDITION:  °Stable ° °ACTIVITY:  °Activity as tolerated ° °OXYGEN:  °Home Oxygen: Yes.   °  °Oxygen Delivery: 2 liters/min via Patient connected to nasal cannula oxygen ° °DISCHARGE LOCATION:  °home  ° °If you experience worsening of your admission symptoms, develop shortness of breath, life threatening emergency, suicidal or homicidal thoughts you must seek medical attention immediately by calling 911 or calling your MD immediately  if symptoms less severe. ° °You Must read complete instructions/literature along with all the possible adverse reactions/side effects for all the Medicines you take and that have been prescribed to you. Take any new Medicines after you have completely understood and accpet all the possible adverse reactions/side effects.  ° °Please note ° °You were cared for by a hospitalist during your hospital stay. If you have any questions about your discharge medications or the care you received while you were in the hospital after you are discharged, you can call the unit and asked to speak with the hospitalist on call if the hospitalist that took care of you is not available. Once you are discharged, your primary care physician will handle any further medical issues. Please note that NO REFILLS for any discharge medications will be authorized once you are discharged, as it is imperative that you return to your primary care physician (or establish a relationship with a primary care physician if you do not have one) for your aftercare needs so that they can reassess your need for medications and monitor your lab values. ° ° ° °

## 2015-10-01 ENCOUNTER — Telehealth: Payer: Self-pay | Admitting: Internal Medicine

## 2015-10-01 ENCOUNTER — Encounter: Payer: Medicaid Other | Admitting: Internal Medicine

## 2015-10-01 NOTE — Telephone Encounter (Signed)
You can schedule pt for 9am on 10/10/15 for the PFT since I don't have a provider. Thanks.

## 2015-10-01 NOTE — Telephone Encounter (Signed)
lmov for patient to call office  °

## 2015-10-01 NOTE — Discharge Summary (Signed)
[facility] [place] at [facility]   PATIENT NAME: Aaron NorlanderClyde Doyle    MR#:  [id number]  DATE OF BIRTH:  Oct 28, 1955  DATE OF ADMISSION:  09/26/2015 ADMITTING PHYSICIAN: Katharina Caperima Vaickute, MD  DATE OF DISCHARGE: 09/28/2015  2:52 PM  PRIMARY CARE PHYSICIAN: Emilio MathBAUCOM, JENNY B, PA-C   ADMISSION DIAGNOSIS:  Orthostatic hypotension [I95.1] Acute renal failure (ARF) (HCC) [N17.9] Acute renal failure, unspecified acute renal failure type (HCC) [N17.9]  DISCHARGE DIAGNOSIS:  Active Problems:   Acute renal failure (ARF) (HCC)   SECONDARY DIAGNOSIS:   Past Medical History  Diagnosis Date  . Mixed hyperlipidemia   . Hypertension   . Nonrheumatic mitral valve disorder   . Osteoarthritis of knee   . Chronic gout   . Colon polyp   . Glaucoma   . Obesity   . Shortness of breath   . CHF (congestive heart failure) (HCC)   . Bilateral lower extremity edema   . Obstructive sleep apnea   . Heart murmur   . Smoking   . Mild pulmonary hypertension (HCC)      ADMITTING HISTORY  HISTORY OF PRESENT ILLNESS: Aaron NorlanderClyde Soo is a 60 y.o. male with a known history of Chronic respiratory failure, on 2 L of oxygen through nasal cannula 24 7, since last admission in June 2017, CHF, COPD, suspected obstructive sleep apnea, hyperlipidemia, obesity, pulmonary hypertension, who presents to the hospital with complaints of shortness of breath, hypotension. Apparently patient was admitted at the beginning of June for mild CHF exacerbation, COPD, he was given Lasix, however, he is lower. Exam of the swelling did not improve significantly, he was seen by Dr. Juliann Paresallwood about a week ago and his Lasix was changed to torsemide. Over the past couple of days. Patient has noticed that his urinary output has decreased and he became weaker. Today he was seen by his primary care physician, his blood pressure was checked and this was found to be low 70s systolic. On arrival to emergency room, his systolic blood  pressure was as low as 60/50, IV fluids were given. His blood pressure somewhat improved. His labs revealed markedly elevation of creatinine to 6.06, also hyponatremia, acidosis. Hospitalist services were contacted for admission  HOSPITAL COURSE:   #. Acute kidney injury over CKD stage III - improving Due to diuresis and naproxen Hold diuretics Treated with IV fluids in the hospital. Repeat labs Showed creatinine close to normal at 1.56 Ultrasound kidneys reviewed Discussed with Dr. Thedore MinsSingh of nephrology At discharge patient's torsemide is being reduced from 40 mg twice a day to 20 mg daily. Discontinue metolazone.  #. Hypovolemic Hyponatremia Improved with IV fluids  #. Diabetes mellitus type 2 with hemoglobin A1c 6.5 Sliding scale insulin  # chronic respiratory failure, continue oxygen At 2 L/m Patient has appointment with pulmonary on Monday at 4 PM for pulmonary function tests  #. Hypertension Medication changes.  # Chronic diastolic CHF No signs of fluid overload.  # DVT prophylaxis with heparin  Stable for discharge home to follow with pulmonary and cardiology as outpatient.  CONSULTS OBTAINED:  Treatment Team:  Mosetta PigeonHarmeet Singh, MD  DRUG ALLERGIES:  No Known Allergies  DISCHARGE MEDICATIONS:   Discharge Medication List as of 09/28/2015  1:15 PM    CONTINUE these medications which have CHANGED   Details  torsemide (DEMADEX) 20 MG tablet Take 1 tablet (20 mg total) by mouth daily., Starting 09/28/2015, Until Discontinued, No Print      CONTINUE these medications which have  NOT CHANGED   Details  allopurinol (ZYLOPRIM) 300 MG tablet Take 300 mg by mouth at bedtime., Until Discontinued, Historical Med    aspirin EC 81 MG tablet Take 81 mg by mouth daily., Until Discontinued, Historical Med    atorvastatin (LIPITOR) 40 MG tablet Take 40 mg by mouth daily., Until Discontinued, Historical Med    carvedilol (COREG) 6.25 MG tablet Take 6.25 mg by mouth 2 (two) times daily  with a meal., Until Discontinued, Historical Med    mometasone-formoterol (DULERA) 100-5 MCG/ACT AERO Inhale 2 puffs into the lungs 2 (two) times daily., Starting 09/04/2015, Until Discontinued, Print    tiotropium (SPIRIVA) 18 MCG inhalation capsule Place 1 capsule (18 mcg total) into inhaler and inhale daily., Starting 09/04/2015, Until Discontinued, Print    Travoprost, BAK Free, (TRAVATAN) 0.004 % SOLN ophthalmic solution Place 1 drop into both eyes at bedtime., Until Discontinued, Historical Med      STOP taking these medications     benazepril (LOTENSIN) 40 MG tablet      naproxen (NAPROSYN) 250 MG tablet      sulfamethoxazole-trimethoprim (BACTRIM DS,SEPTRA DS) 800-160 MG tablet         Today   VITAL SIGNS:  Blood pressure 119/76, pulse 80, temperature 97.8 F (36.6 C), temperature source Oral, resp. rate 16, height 6' (1.829 m), weight 120.3 kg (265 lb 3.4 oz), SpO2 98 %.  I/O:  No intake or output data in the 24 hours ending 10/01/15 1451  PHYSICAL EXAMINATION:  Physical Exam  GENERAL:  60 y.o.-year-old patient lying in the bed with no acute distress.  LUNGS: Normal breath sounds bilaterally, no wheezing, rales,rhonchi or crepitation. No use of accessory muscles of respiration.  CARDIOVASCULAR: S1, S2 normal. No murmurs, rubs, or gallops.  ABDOMEN: Soft, non-tender, non-distended. Bowel sounds present. No organomegaly or mass.  NEUROLOGIC: Moves all 4 extremities. PSYCHIATRIC: The patient is alert and oriented x 3.  SKIN: No obvious rash, lesion, or ulcer.   DATA REVIEW:   CBC  Recent Labs Lab 09/27/15 0558  WBC 4.6  HGB 13.1  HCT 38.8*  PLT 186    Chemistries   Recent Labs Lab 09/26/15 1214  09/28/15 1113  NA 133*  < > 137  K 4.3  < > 4.8  CL 94*  < > 96*  CO2 23  < > 33*  GLUCOSE 133*  < > 111*  BUN 87*  < > 30*  CREATININE 6.06*  < > 1.52*  CALCIUM 8.5*  < > 8.8*  MG 2.2  --   --   < > = values in this interval not displayed.  Cardiac  Enzymes  Recent Labs Lab 09/26/15 1214  TROPONINI <0.03    Microbiology Results  Results for orders placed or performed during the hospital encounter of 09/26/15  MRSA PCR Screening     Status: None   Collection Time: 09/26/15  4:47 PM  Result Value Ref Range Status   MRSA by PCR NEGATIVE NEGATIVE Final    Comment:        The GeneXpert MRSA Assay (FDA approved for NASAL specimens only), is one component of a comprehensive MRSA colonization surveillance program. It is not intended to diagnose MRSA infection nor to guide or monitor treatment for MRSA infections.     RADIOLOGY:  No results found.  Follow up with PCP in 1 week.  Management plans discussed with the patient, family and they are in agreement.  CODE STATUS:  Code Status History  Date Active Date Inactive Code Status Order ID Comments User Context   09/26/2015  4:41 PM 09/28/2015  5:52 PM Full Code [id number]  Katharina Caperima Vaickute, MD Inpatient   09/02/2015  1:02 AM 09/04/2015  8:24 PM Full Code [id number]  Oralia Manisavid Willis, MD Inpatient      TOTAL TIME TAKING CARE OF THIS PATIENT ON DAY OF DISCHARGE: more than 30 minutes.   Milagros LollSudini, Traeger Sultana R M.D on 10/01/2015 at 2:51 PM  Between 7am to 6pm - Pager - [phone]  After 6pm go to [email or web address] - password [email or web address] [facility]  Office  [phone]  CC: Primary care physician; Abran RichardBAUCOM, JENNY B, PA-C  Note: This dictation was prepared with Dragon dictation along with smaller phrase technology. Any transcriptional errors that result from this process are unintentional.

## 2015-10-01 NOTE — Progress Notes (Signed)
[facility]     Assessment and Plan:  60 year old male with history of hypertension, hyperlipidemia, obesity, congestive heart failure  with  CHF/COPD.  -Continue prednisone, weaning, continue Spiriva, Dulera. -Follow-up outpatient in 3-4 weeks, we'll check folate function testing at that time. -Elevated right diaphragm, likely contributing to dyspnea.  Pulmonary hypertension. -Mean PAP equals 32. Outpatient evaluation for sleep apnea treatment, and nocturnal oximetry.  Obstructive sleep apnea. -Noncompliant with therapy, outpatient follow-up for further management to see if we can help get him back on CPAP.  Morbid obesity. -Likely contributing to dyspnea, weight loss would be beneficial.  Nicotine abuse. -Discussed smoking cessation.  Alcohol abuse. -Discussed cessation.   Date: 10/01/2015  MRN# [id number] Aaron Doyle 1956/02/10   Aaron Doyle is a 60 y.o. old male seen in follow up for chief complaint of  No chief complaint on file.    HPI:   Patient is a 60 year old male with a history of COPD he was recently seen in the hospital, discharged on 09/04/15 with COPD and CHF exacerbation. He was discharged on 2 L of oxygen, Dulera, Spiriva, prednisone taper, azithromycin.  Medication:   Outpatient Encounter Prescriptions as of 10/01/2015  Medication Sig  . allopurinol (ZYLOPRIM) 300 MG tablet Take 300 mg by mouth at bedtime.  Marland Kitchen. aspirin EC 81 MG tablet Take 81 mg by mouth daily.  Marland Kitchen. atorvastatin (LIPITOR) 40 MG tablet Take 40 mg by mouth daily.  . carvedilol (COREG) 6.25 MG tablet Take 6.25 mg by mouth 2 (two) times daily with a meal.  . mometasone-formoterol (DULERA) 100-5 MCG/ACT AERO Inhale 2 puffs into the lungs 2 (two) times daily.  Marland Kitchen. tiotropium (SPIRIVA) 18 MCG inhalation capsule Place 1 capsule (18 mcg total) into inhaler and inhale daily.  Marland Kitchen. torsemide (DEMADEX) 20 MG tablet Take 1 tablet (20 mg total) by mouth daily.  . Travoprost, BAK  Free, (TRAVATAN) 0.004 % SOLN ophthalmic solution Place 1 drop into both eyes at bedtime.   No facility-administered encounter medications on file as of 10/01/2015.     Allergies:  Review of patient's allergies indicates no known allergies.  Review of Systems: Gen:  Denies  fever, sweats. HEENT: Denies blurred vision. Cvc:  No dizziness, chest pain or heaviness Resp:   Denies cough or sputum porduction. Gi: Denies swallowing difficulty, stomach pain. constipation, bowel incontinence Gu:  Denies bladder incontinence, burning urine Ext:   No Joint pain, stiffness. Skin: No skin rash, easy bruising. Endoc:  No polyuria, polydipsia. Psych: No depression, insomnia. Other:  All other systems were reviewed and found to be negative other than what is mentioned in the HPI.   Physical Examination:   VS: There were no vitals taken for this visit.  General Appearance: No distress  Neuro:without focal findings,  speech normal,  HEENT: PERRLA, EOM intact. Pulmonary: normal breath sounds, No wheezing.   CardiovascularNormal S1,S2.  No m/r/g.   Abdomen: Benign, Soft, non-tender. Renal:  No costovertebral tenderness  GU:  Not performed at this time. Endoc: No evident thyromegaly, no signs of acromegaly. Skin:   warm, no rash. Extremities: normal, no cyanosis, clubbing.   LABORATORY PANEL:   CBC  Recent Labs Lab 09/27/15 0558  WBC 4.6  HGB 13.1  HCT 38.8*  PLT 186   ------------------------------------------------------------------------------------------------------------------  Chemistries   Recent Labs Lab 09/26/15 1214  09/28/15 1113  NA 133*  < > 137  K 4.3  < > 4.8  CL 94*  < > 96*  CO2  23  < > 33*  GLUCOSE 133*  < > 111*  BUN 87*  < > 30*  CREATININE 6.06*  < > 1.52*  CALCIUM 8.5*  < > 8.8*  MG 2.2  --   --   < > = values in this interval not  displayed. ------------------------------------------------------------------------------------------------------------------  Cardiac Enzymes  Recent Labs Lab 09/26/15 1214  TROPONINI <0.03   ------------------------------------------------------------  RADIOLOGY:   No results found for this or any previous visit. Results for orders placed during the hospital encounter of 09/01/15  DG Chest 2 View   Narrative CLINICAL DATA:  60 year old male with shortness of breath  EXAM: CHEST  2 VIEW  COMPARISON:  Radiograph dated 12/26/2009  FINDINGS: There is shallow inspiration. Bibasilar atelectatic changes noted. Pneumonia is less likely. Clinical correlation is recommended. There is no pleural effusion or pneumothorax. Stable cardiac silhouette. There is degenerative changes of the shoulders and spine. Lower cervical fixation plate and screws noted. No acute osseous pathology.  IMPRESSION: Shallow inspiration with bibasilar atelectasis.   Electronically Signed   By: Elgie CollardArash  Radparvar M.D.   On: 09/01/2015 21:21    ------------------------------------------------------------------------------------------------------------------  Thank  you for allowing [facility] to assist in the care of your patient. Our recommendations are noted above.  Please contact us if we can be of further service.   Wells Guileseep Earl Zellmer, MD.  [facility] Office Number: [phone]  Santiago Gladavid Kasa, M.D.  Stephanie AcreVishal Mungal, M.D.  Billy Fischeravid Simonds, M.D  10/01/2015   This encounter was created in error - please disregard.

## 2015-10-01 NOTE — Telephone Encounter (Signed)
Patient [place] pftn [place].  Patient needs to have pft prior to fu appt with Dr. Ardyth Manam.  Is there a sooner pft apt. Than the end of the month?

## 2015-10-03 ENCOUNTER — Telehealth: Payer: Self-pay | Admitting: Internal Medicine

## 2015-10-03 NOTE — Telephone Encounter (Signed)
Pt calling stating he is on medicaid and will be starting disability next month Pt has two upcoming appointments in august  But patient won't have coverage then.  Would like to know if we can possibly move patient to a date in July for the appointments. Please advise.

## 2015-10-03 NOTE — Telephone Encounter (Signed)
Spoke with pt and informed there is nothing available with DR before end of July. Nothing further needed.

## 2015-10-06 NOTE — Telephone Encounter (Signed)
lmov to schedule cancelled appt.

## 2015-10-09 ENCOUNTER — Emergency Department: Payer: Medicaid Other

## 2015-10-09 ENCOUNTER — Encounter: Payer: Self-pay | Admitting: Urgent Care

## 2015-10-09 ENCOUNTER — Observation Stay
Admission: EM | Admit: 2015-10-09 | Discharge: 2015-10-10 | Disposition: A | Discharge status: Home / Self Care | Payer: Medicaid Other | Attending: Internal Medicine | Admitting: Internal Medicine

## 2015-10-09 DIAGNOSIS — Z981 Arthrodesis status: Secondary | ICD-10-CM | POA: Insufficient documentation

## 2015-10-09 DIAGNOSIS — R609 Edema, unspecified: Secondary | ICD-10-CM | POA: Diagnosis present

## 2015-10-09 DIAGNOSIS — Z79899 Other long term (current) drug therapy: Secondary | ICD-10-CM | POA: Insufficient documentation

## 2015-10-09 DIAGNOSIS — G4733 Obstructive sleep apnea (adult) (pediatric): Secondary | ICD-10-CM | POA: Diagnosis not present

## 2015-10-09 DIAGNOSIS — H409 Unspecified glaucoma: Secondary | ICD-10-CM | POA: Diagnosis not present

## 2015-10-09 DIAGNOSIS — J449 Chronic obstructive pulmonary disease, unspecified: Secondary | ICD-10-CM | POA: Diagnosis present

## 2015-10-09 DIAGNOSIS — R0600 Dyspnea, unspecified: Secondary | ICD-10-CM | POA: Diagnosis not present

## 2015-10-09 DIAGNOSIS — Z8249 Family history of ischemic heart disease and other diseases of the circulatory system: Secondary | ICD-10-CM | POA: Insufficient documentation

## 2015-10-09 DIAGNOSIS — Z8261 Family history of arthritis: Secondary | ICD-10-CM | POA: Diagnosis not present

## 2015-10-09 DIAGNOSIS — R011 Cardiac murmur, unspecified: Secondary | ICD-10-CM | POA: Diagnosis not present

## 2015-10-09 DIAGNOSIS — E782 Mixed hyperlipidemia: Secondary | ICD-10-CM | POA: Diagnosis not present

## 2015-10-09 DIAGNOSIS — E669 Obesity, unspecified: Secondary | ICD-10-CM

## 2015-10-09 DIAGNOSIS — Z6837 Body mass index (BMI) 37.0-37.9, adult: Secondary | ICD-10-CM | POA: Insufficient documentation

## 2015-10-09 DIAGNOSIS — I110 Hypertensive heart disease with heart failure: Secondary | ICD-10-CM | POA: Diagnosis not present

## 2015-10-09 DIAGNOSIS — F17210 Nicotine dependence, cigarettes, uncomplicated: Secondary | ICD-10-CM | POA: Diagnosis not present

## 2015-10-09 DIAGNOSIS — I272 Other secondary pulmonary hypertension: Secondary | ICD-10-CM | POA: Diagnosis not present

## 2015-10-09 DIAGNOSIS — Z820 Family history of epilepsy and other diseases of the nervous system: Secondary | ICD-10-CM | POA: Diagnosis not present

## 2015-10-09 DIAGNOSIS — J441 Chronic obstructive pulmonary disease with (acute) exacerbation: Secondary | ICD-10-CM | POA: Diagnosis not present

## 2015-10-09 DIAGNOSIS — I4510 Unspecified right bundle-branch block: Secondary | ICD-10-CM | POA: Insufficient documentation

## 2015-10-09 DIAGNOSIS — I5033 Acute on chronic diastolic (congestive) heart failure: Secondary | ICD-10-CM | POA: Diagnosis present

## 2015-10-09 DIAGNOSIS — I10 Essential (primary) hypertension: Secondary | ICD-10-CM | POA: Diagnosis present

## 2015-10-09 DIAGNOSIS — D649 Anemia, unspecified: Secondary | ICD-10-CM

## 2015-10-09 DIAGNOSIS — Z7982 Long term (current) use of aspirin: Secondary | ICD-10-CM | POA: Insufficient documentation

## 2015-10-09 DIAGNOSIS — E662 Morbid (severe) obesity with alveolar hypoventilation: Secondary | ICD-10-CM | POA: Diagnosis not present

## 2015-10-09 DIAGNOSIS — M7989 Other specified soft tissue disorders: Secondary | ICD-10-CM

## 2015-10-09 LAB — CBC
HCT: 37.1 % — ABNORMAL LOW (ref 40.0–52.0)
HEMOGLOBIN: 12.6 g/dL — AB (ref 13.0–18.0)
MCH: 33.1 pg (ref 26.0–34.0)
MCHC: 33.9 g/dL (ref 32.0–36.0)
MCV: 97.5 fL (ref 80.0–100.0)
Platelets: 285 10*3/uL (ref 150–440)
RBC: 3.81 MIL/uL — ABNORMAL LOW (ref 4.40–5.90)
RDW: 13.9 % (ref 11.5–14.5)
WBC: 8.2 10*3/uL (ref 3.8–10.6)

## 2015-10-09 LAB — BASIC METABOLIC PANEL
ANION GAP: 8 (ref 5–15)
BUN: 16 mg/dL (ref 6–20)
CALCIUM: 8.7 mg/dL — AB (ref 8.9–10.3)
CO2: 30 mmol/L (ref 22–32)
Chloride: 102 mmol/L (ref 101–111)
Creatinine, Ser: 0.88 mg/dL (ref 0.61–1.24)
GLUCOSE: 184 mg/dL — AB (ref 65–99)
Potassium: 4.3 mmol/L (ref 3.5–5.1)
Sodium: 140 mmol/L (ref 135–145)

## 2015-10-09 LAB — BRAIN NATRIURETIC PEPTIDE: B NATRIURETIC PEPTIDE 5: 104 pg/mL — AB (ref 0.0–100.0)

## 2015-10-09 LAB — TROPONIN I

## 2015-10-09 MED ORDER — ENOXAPARIN SODIUM 40 MG/0.4ML ~~LOC~~ SOLN: 40.0000 mg | SUBCUTANEOUS | Status: DC

## 2015-10-09 MED ORDER — FUROSEMIDE 10 MG/ML IJ SOLN
20.0000 mg | Freq: Once | INTRAMUSCULAR | Status: AC
  Administered 2015-10-09: 20 mg via INTRAVENOUS
  Filled 2015-10-09: qty 4

## 2015-10-09 MED ORDER — ACETAMINOPHEN 650 MG RE SUPP: 650.0000 mg | Freq: Four times a day (QID) | RECTAL | Status: DC | PRN

## 2015-10-09 MED ORDER — ONDANSETRON HCL 4 MG/2ML IJ SOLN: 4.0000 mg | Freq: Four times a day (QID) | INTRAMUSCULAR | Status: DC | PRN

## 2015-10-09 MED ORDER — ALLOPURINOL 100 MG PO TABS
300.0000 mg | ORAL_TABLET | Freq: Every day | ORAL | Status: DC
  Administered 2015-10-10: 300 mg via ORAL
  Filled 2015-10-09: qty 3

## 2015-10-09 MED ORDER — ASPIRIN EC 81 MG PO TBEC
81.0000 mg | DELAYED_RELEASE_TABLET | Freq: Every day | ORAL | Status: DC
  Administered 2015-10-10: 81 mg via ORAL
  Filled 2015-10-09: qty 1

## 2015-10-09 MED ORDER — SODIUM CHLORIDE 0.9% FLUSH
3.0000 mL | Freq: Two times a day (BID) | INTRAVENOUS | Status: DC
  Administered 2015-10-10 (×2): 3 mL via INTRAVENOUS

## 2015-10-09 MED ORDER — ACETAMINOPHEN 325 MG PO TABS: 650.0000 mg | ORAL_TABLET | Freq: Four times a day (QID) | ORAL | Status: DC | PRN

## 2015-10-09 MED ORDER — FUROSEMIDE 10 MG/ML IJ SOLN: 20.0000 mg | Freq: Once | INTRAMUSCULAR | Status: DC

## 2015-10-09 MED ORDER — CARVEDILOL 6.25 MG PO TABS
6.2500 mg | ORAL_TABLET | Freq: Two times a day (BID) | ORAL | Status: DC
  Administered 2015-10-10: 6.25 mg via ORAL
  Filled 2015-10-09: qty 1

## 2015-10-09 MED ORDER — TORSEMIDE 20 MG PO TABS: 20.0000 mg | ORAL_TABLET | Freq: Every day | ORAL | Status: DC

## 2015-10-09 MED ORDER — LATANOPROST 0.005 % OP SOLN
1.0000 [drp] | Freq: Every day | OPHTHALMIC | Status: DC
  Administered 2015-10-10: 1 [drp] via OPHTHALMIC
  Filled 2015-10-09: qty 2.5

## 2015-10-09 MED ORDER — ATORVASTATIN CALCIUM 20 MG PO TABS
40.0000 mg | ORAL_TABLET | Freq: Every day | ORAL | Status: DC
  Administered 2015-10-10: 40 mg via ORAL
  Filled 2015-10-09: qty 2

## 2015-10-09 MED ORDER — MOMETASONE FURO-FORMOTEROL FUM 100-5 MCG/ACT IN AERO
2.0000 | INHALATION_SPRAY | Freq: Two times a day (BID) | RESPIRATORY_TRACT | Status: DC
  Administered 2015-10-10 (×2): 2 via RESPIRATORY_TRACT
  Filled 2015-10-09: qty 8.8

## 2015-10-09 MED ORDER — ONDANSETRON HCL 4 MG PO TABS: 4.0000 mg | ORAL_TABLET | Freq: Four times a day (QID) | ORAL | Status: DC | PRN

## 2015-10-09 MED ORDER — TIOTROPIUM BROMIDE MONOHYDRATE 18 MCG IN CAPS
18.0000 ug | ORAL_CAPSULE | Freq: Every day | RESPIRATORY_TRACT | Status: DC
Start: 2015-10-10 — End: 2015-10-10
  Administered 2015-10-10: 18 ug via RESPIRATORY_TRACT
  Filled 2015-10-09: qty 5

## 2015-10-09 NOTE — ED Notes (Signed)
MD Willis at bedside. 

## 2015-10-09 NOTE — ED Provider Notes (Signed)
[facility] Emergency Department Provider Note  ____________________________________________  Time seen: 9:00 PM  I have reviewed the triage vital signs and the nursing notes.   HISTORY  Chief Complaint Shortness of Breath and Leg Swelling    HPI Aaron Doyle is a 60 y.o. male complains of worsening shortness of breath and dyspnea on exertion over the past week associated with a 19 pound weight gain. He has chronic diastolic heart failure, with 2 recent hospitalizations last month, 1 due to CHF exacerbation and the other due to overdiuresis and hypotension.  Since the second hospitalization he's not been taking any diuretic. He followed up with Dr. Juliann Pares his cardiologist 1 week ago who according to his note advised restarting the diuretic, but the patient hasn't been taking it. Otherwise been eating and drinking normally, no exertional chest pain. No dizziness or syncope..     Past Medical History  Diagnosis Date  . Mixed hyperlipidemia   . Hypertension   . Nonrheumatic mitral valve disorder   . Osteoarthritis of knee   . Chronic gout   . Colon polyp   . Glaucoma   . Obesity   . Shortness of breath   . CHF (congestive heart failure) (HCC)   . Bilateral lower extremity edema   . Obstructive sleep apnea   . Heart murmur   . Smoking   . Mild pulmonary hypertension [facility])      Patient Active Problem List   Diagnosis Date Noted  . Acute renal failure (ARF) (HCC) 09/26/2015  . COPD exacerbation (HCC) 09/01/2015  . HTN (hypertension) 09/01/2015  . CHF (congestive heart failure) (HCC) 09/01/2015  . OSA (obstructive sleep apnea) 09/01/2015     Past Surgical History  Procedure Laterality Date  . Cardiac catheterization N/A 06/24/2015    Procedure: Right/Left Heart Cath and Coronary Angiography;  Surgeon: Alwyn Pea, MD;  Location: [facility] INVASIVE CV LAB;  Service: Cardiovascular;  Laterality: N/A;  . Cervical fusion       Current  Outpatient Rx  Name  Route  Sig  Dispense  Refill  . allopurinol (ZYLOPRIM) 300 MG tablet   Oral   Take 300 mg by mouth at bedtime.         Marland Kitchen aspirin EC 81 MG tablet   Oral   Take 81 mg by mouth daily.         Marland Kitchen atorvastatin (LIPITOR) 40 MG tablet   Oral   Take 40 mg by mouth daily.         . carvedilol (COREG) 6.25 MG tablet   Oral   Take 6.25 mg by mouth 2 (two) times daily with a meal.         . mometasone-formoterol (DULERA) 100-5 MCG/ACT AERO   Inhalation   Inhale 2 puffs into the lungs 2 (two) times daily.   13 g   0   . tiotropium (SPIRIVA) 18 MCG inhalation capsule   Inhalation   Place 1 capsule (18 mcg total) into inhaler and inhale daily.   30 capsule   12   . torsemide (DEMADEX) 20 MG tablet   Oral   Take 1 tablet (20 mg total) by mouth daily.         . Travoprost, BAK Free, (TRAVATAN) 0.004 % SOLN ophthalmic solution   Both Eyes   Place 1 drop into both eyes at bedtime.            Allergies Review of patient's allergies indicates no known allergies.  Family History  Problem Relation Age of Onset  . Arthritis    . Heart attack    . Multiple sclerosis      Social History Social History  Substance Use Topics  . Smoking status: Current Every Day Smoker -- 0.25 packs/day for 40 years    Types: Cigarettes  . Smokeless tobacco: None  . Alcohol Use: 1.8 oz/week    3 Shots of liquor per week    Review of Systems  Constitutional:   No fever or chills.  ENT:   No sore throat. No rhinorrhea. Cardiovascular:   No chest pain. Respiratory:  Positive shortness of breath. No cough. Gastrointestinal:   Negative for abdominal pain, vomiting and diarrhea.  Genitourinary:   Negative for dysuria or difficulty urinating. Musculoskeletal:   Negative for focal pain or swelling Neurological:   Negative for headaches 10-point ROS otherwise negative.  ____________________________________________   PHYSICAL EXAM:  VITAL SIGNS: ED Triage  Vitals  Enc Vitals Group     BP 10/09/15 1922 175/78 mmHg     Pulse Rate 10/09/15 1922 106     Resp 10/09/15 1922 26     Temp 10/09/15 1922 98.1 F (36.7 C)     Temp src --      SpO2 10/09/15 1922 93 %     Weight 10/09/15 1922 266 lb (120.657 kg)     Height --      Head Cir --      Peak Flow --      Pain Score 10/09/15 1923 0     Pain Loc --      Pain Edu? --      Excl. in GC? --     Vital signs reviewed, nursing assessments reviewed.   Constitutional:   Alert and oriented. Not in distress. Eyes:   No scleral icterus. No conjunctival pallor. PERRL. EOMI.  No nystagmus. ENT   Head:   Normocephalic and atraumatic.   Nose:   No congestion/rhinnorhea. No septal hematoma   Mouth/Throat:   MMM, no pharyngeal erythema. No peritonsillar mass.    Neck:   No stridor. No SubQ emphysema. No meningismus. No JVD Hematological/Lymphatic/Immunilogical:   No cervical lymphadenopathy. Cardiovascular:   RRR. Symmetric bilateral radial and DP pulses.  No murmurs.  Respiratory:   Normal respiratory effort without tachypnea nor retractions. Breath sounds are clear and equal bilaterally. No wheezes/rales/rhonchi. Gastrointestinal:   Soft and nontender. Non distended. There is no CVA tenderness.  No rebound, rigidity, or guarding. Genitourinary:   deferred Musculoskeletal:   Nontender with normal range of motion in all extremities. No joint effusions.  No lower extremity tenderness.  2+ pitting edema bilaterally, symmetric. Neurologic:   Normal speech and language.  CN 2-10 normal. Motor grossly intact. No gross focal neurologic deficits are appreciated.  Skin:    Skin is warm, dry and intact. No rash noted.  No petechiae, purpura, or bullae.  ____________________________________________    LABS (pertinent positives/negatives) (all labs ordered are listed, but only abnormal results are displayed) Labs Reviewed  BASIC METABOLIC PANEL - Abnormal; Notable for the following:     Glucose, Bld 184 (*)    Calcium 8.7 (*)    All other components within normal limits  CBC - Abnormal; Notable for the following:    RBC 3.81 (*)    Hemoglobin 12.6 (*)    HCT 37.1 (*)    All other components within normal limits  BRAIN NATRIURETIC PEPTIDE - Abnormal; Notable for the following:  B Natriuretic Peptide 104.0 (*)    All other components within normal limits  TROPONIN I   ____________________________________________   EKG  Interpreted by me Normal sinus rhythm rate of 81, normal axis. Normal intervals. Right bundle branch block with diffuse inferior and anterior T waves inversions. No acute ST changes.Unchanged from 09/29/2015  ____________________________________________    RADIOLOGY  Chest x-ray unremarkable  ____________________________________________   PROCEDURES   ____________________________________________   INITIAL IMPRESSION / ASSESSMENT AND PLAN / ED COURSE  Pertinent labs & imaging results that were available during my care of the patient were reviewed by me and considered in my medical decision making (see chart for details).  Patient presents with significant weight gain over the past week, with mild exacerbation of his chronic diastolic heart failure. Oxygen is borderline low on his chronic 2 L nasal cannula at about 93% at rest. Patient is describing a severe worsening of his exercise tolerance with this. Labs and chest x-ray are unremarkable. No significant JVD on exam and lungs are clear to auscultation. I discussed this with cardiology on-call Dr. Gwen PoundsKowalski who suggests that this could be managed inpatient or outpatient and the either would be reasonable in his view. I agree. On reviewing the prior records it does appear that the initial for the patient's readmission was that he over diuresed at home and had to receive IV fluids. I had a discussion with the patient were waiting H and shared decision making, advised that I think that this could  be treatable with a dose of IV Lasix now and continuing diuretics at home alone to 6 over the weekend, but he is very uncomfortable with this given his recent experiences. I therefore discussed with the hospitalist will evaluate for further management in the hospital here. Low suspicion for ACS PE or dissection.     ____________________________________________   FINAL CLINICAL IMPRESSION(S) / ED DIAGNOSES  Final diagnoses:  Acute on chronic diastolic heart failure (HCC)  Peripheral edema       Portions of this note were generated with dragon dictation software. Dictation errors may occur despite best attempts at proofreading.   Sharman CheekPhillip Wyndi Northrup, MD 10/09/15 2303

## 2015-10-09 NOTE — H&P (Signed)
[facility] - [place] at [facility]   PATIENT NAME: Aaron Doyle    MR#:  [id number]  DATE OF BIRTH:  08/30/1955  DATE OF ADMISSION:  10/09/2015  PRIMARY CARE PHYSICIAN: Abran Richard, PA-C   REQUESTING/REFERRING PHYSICIAN: Scotty Court, MD  CHIEF COMPLAINT:   Chief Complaint  Patient presents with  . Shortness of Breath  . Leg Swelling    HISTORY OF PRESENT ILLNESS:  Aaron Doyle  is a 60 y.o. male who presents with Complaint of significant leg swelling, mild shortness of breath, and weight gain. Patient has been hospitalized multiple times this month in different hospitals for severe low back and forth pendulum effect between diuresis and acute kidney injury.  He comes in tonight because he was most recently taken off of his diuretics and since that time states that he has developed 20 pound weight gain with significant lower extremity edema. He is concerned about taking his diuretics as he has suffered from significant drops in his blood pressure over this month with diuresis. Cardiology was contacted by ED physician and state that they did not feel like it was unreasonable to allow the patient states tonight to initiate diuresis if that he wanted. Patient states he would be more comfortable staying tonight. Hospitalists were called for admission  PAST MEDICAL HISTORY:   Past Medical History  Diagnosis Date  . Mixed hyperlipidemia   . Hypertension   . Nonrheumatic mitral valve disorder   . Osteoarthritis of knee   . Chronic gout   . Colon polyp   . Glaucoma   . Obesity   . Shortness of breath   . CHF (congestive heart failure) (HCC)   . Bilateral lower extremity edema   . Obstructive sleep apnea   . Heart murmur   . Smoking   . Mild pulmonary hypertension (HCC)     PAST SURGICAL HISTORY:   Past Surgical History  Procedure Laterality Date  . Cardiac catheterization N/A 06/24/2015    Procedure: Right/Left Heart Cath and Coronary Angiography;   Surgeon: Alwyn Pea, MD;  Location: [facility] INVASIVE CV LAB;  Service: Cardiovascular;  Laterality: N/A;  . Cervical fusion      SOCIAL HISTORY:   Social History  Substance Use Topics  . Smoking status: Current Every Day Smoker -- 0.25 packs/day for 40 years    Types: Cigarettes  . Smokeless tobacco: Not on file  . Alcohol Use: 1.8 oz/week    3 Shots of liquor per week    FAMILY HISTORY:   Family History  Problem Relation Age of Onset  . Arthritis    . Heart attack    . Multiple sclerosis      DRUG ALLERGIES:  No Known Allergies  MEDICATIONS AT HOME:   Prior to Admission medications   Medication Sig Start Date End Date Taking? Authorizing Provider  allopurinol (ZYLOPRIM) 300 MG tablet Take 300 mg by mouth at bedtime.   Yes Historical Provider, MD  aspirin EC 81 MG tablet Take 81 mg by mouth daily.   Yes Historical Provider, MD  atorvastatin (LIPITOR) 40 MG tablet Take 40 mg by mouth daily.   Yes Historical Provider, MD  carvedilol (COREG) 6.25 MG tablet Take 6.25 mg by mouth 2 (two) times daily with a meal.   Yes Historical Provider, MD  mometasone-formoterol (DULERA) 100-5 MCG/ACT AERO Inhale 2 puffs into the lungs 2 (two) times daily. 09/04/15  Yes Wyatt Haste, MD  tiotropium (SPIRIVA) 18 MCG inhalation capsule Place  1 capsule (18 mcg total) into inhaler and inhale daily. 09/04/15  Yes Wyatt Hasteavid K Hower, MD  torsemide (DEMADEX) 20 MG tablet Take 1 tablet (20 mg total) by mouth daily. 09/28/15  Yes Srikar Sudini, MD  Travoprost, BAK Free, (TRAVATAN) 0.004 % SOLN ophthalmic solution Place 1 drop into both eyes at bedtime.   Yes Historical Provider, MD    REVIEW OF SYSTEMS:  Review of Systems  Constitutional: Negative for fever, chills, weight loss and malaise/fatigue.  HENT: Negative for ear pain, hearing loss and tinnitus.   Eyes: Negative for blurred vision, double vision, pain and redness.  Respiratory: Positive for shortness of breath. Negative for cough and hemoptysis.    Cardiovascular: Positive for leg swelling. Negative for chest pain, palpitations and orthopnea.  Gastrointestinal: Negative for nausea, vomiting, abdominal pain, diarrhea and constipation.  Genitourinary: Negative for dysuria, frequency and hematuria.  Musculoskeletal: Negative for back pain, joint pain and neck pain.  Skin:       No acne, rash, or lesions  Neurological: Negative for dizziness, tremors, focal weakness and weakness.  Endo/Heme/Allergies: Negative for polydipsia. Does not bruise/bleed easily.  Psychiatric/Behavioral: Negative for depression. The patient is not nervous/anxious and does not have insomnia.      VITAL SIGNS:   Filed Vitals:   10/09/15 2100 10/09/15 2130 10/09/15 2200 10/09/15 2230  BP: 137/69 138/69 136/51 157/81  Pulse: 76 78 77 77  Temp:      Resp: 22 24 27 22   Weight:      SpO2: 94% 95% 94% 95%   Wt Readings from Last 3 Encounters:  10/09/15 120.657 kg (266 lb)  09/28/15 120.3 kg (265 lb 3.4 oz)  09/02/15 76.159 kg (167 lb 14.4 oz)    PHYSICAL EXAMINATION:  Physical Exam  Vitals reviewed. Constitutional: He is oriented to person, place, and time. He appears well-developed and well-nourished. No distress.  HENT:  Head: Normocephalic and atraumatic.  Mouth/Throat: Oropharynx is clear and moist.  Eyes: Conjunctivae and EOM are normal. Pupils are equal, round, and reactive to light. No scleral icterus.  Neck: Normal range of motion. Neck supple. No JVD present. No thyromegaly present.  Cardiovascular: Normal rate, regular rhythm and intact distal pulses.  Exam reveals no gallop and no friction rub.   Murmur (2/6 systolic murmur) heard. Respiratory: Effort normal and breath sounds normal. No respiratory distress. He has no wheezes. He has no rales.  GI: Soft. Bowel sounds are normal. He exhibits no distension. There is no tenderness.  Musculoskeletal: Normal range of motion. He exhibits edema.  No arthritis, no gout  Lymphadenopathy:    He has  no cervical adenopathy.  Neurological: He is alert and oriented to person, place, and time. No cranial nerve deficit.  No dysarthria, no aphasia  Skin: Skin is warm and dry. No rash noted. No erythema.  Psychiatric: He has a normal mood and affect. His behavior is normal. Judgment and thought content normal.    LABORATORY PANEL:   CBC  Recent Labs Lab 10/09/15 1940  WBC 8.2  HGB 12.6*  HCT 37.1*  PLT 285   ------------------------------------------------------------------------------------------------------------------  Chemistries   Recent Labs Lab 10/09/15 1940  NA 140  K 4.3  CL 102  CO2 30  GLUCOSE 184*  BUN 16  CREATININE 0.88  CALCIUM 8.7*   ------------------------------------------------------------------------------------------------------------------  Cardiac Enzymes  Recent Labs Lab 10/09/15 1940  TROPONINI <0.03   ------------------------------------------------------------------------------------------------------------------  RADIOLOGY:  Dg Chest 2 View  10/09/2015  CLINICAL DATA:  60 year old male with shortness  of breath for several months EXAM: CHEST  2 VIEW COMPARISON:  Chest radiograph dated 09/01/2015 FINDINGS: Two views of the chest demonstrate shallow inspiration with bibasilar subsegmental atelectatic changes. There is no focal consolidation. No pleural effusion or pneumothorax. The cardiac silhouette is within limits. No acute osseous pathology. IMPRESSION: No active cardiopulmonary disease. Electronically Signed   By: Elgie Collard M.D.   On: 10/09/2015 20:06    EKG:   Orders placed or performed during the hospital encounter of 10/09/15  . EKG 12-Lead  . EKG 12-Lead  . EKG 12-Lead  . EKG 12-Lead  . ED EKG within 10 minutes  . ED EKG within 10 minutes    IMPRESSION AND PLAN:  Principal Problem:   Chronic diastolic CHF (congestive heart failure) (HCC) - one dose of IV Lasix given in the ED without any drop in the patient's  blood pressure. We will admit him to the hospital and give him another dose of diuresis in the morning, however we will use torsemide as the patient states he has this medication at home. We will give him a trial of this medicine here in the hospital or return monitor his blood pressure Active Problems:   COPD (HCC) - continue home meds   HTN (hypertension) - currently controlled, continue home meds  All the records are reviewed and case discussed with ED provider. Management plans discussed with the patient and/or family.  DVT PROPHYLAXIS: SubQ lovenox  GI PROPHYLAXIS: None  ADMISSION STATUS: Observation  CODE STATUS: Full Code Status History    Date Active Date Inactive Code Status Order ID Comments User Context   09/26/2015  4:41 PM 09/28/2015  5:52 PM Full Code [id number]  Katharina Caper, MD Inpatient   09/02/2015  1:02 AM 09/04/2015  8:24 PM Full Code [id number]  Oralia Manis, MD Inpatient      TOTAL TIME TAKING CARE OF THIS PATIENT: 40 minutes.    Farhan Jean FIELDING 10/09/2015, 10:55 PM  [facility]  Office  [phone]  CC: Primary care physician; Abran Richard, PA-C

## 2015-10-09 NOTE — ED Notes (Signed)
MD called pt has had 20lb weight increase recently, states may be retaining fluid.

## 2015-10-09 NOTE — ED Notes (Signed)
Patient presents with reports of SOB and generalized edema. Patient has been seen several times before for the same. He wants to make mention of the fact that he has been hospitalized for HYPOtension and ARF. [facility] and Dr. Juliann Paresallwood. Patient reporting an 18lb weight gain in the last week. Patient visibly SOB in triage despite supplemental oxygen use at 2L/Jolly; difficulty speaking in complete sentences secondary to SOB.

## 2015-10-10 ENCOUNTER — Encounter: Payer: Self-pay | Admitting: *Deleted

## 2015-10-10 DIAGNOSIS — D649 Anemia, unspecified: Secondary | ICD-10-CM

## 2015-10-10 DIAGNOSIS — M7989 Other specified soft tissue disorders: Secondary | ICD-10-CM

## 2015-10-10 DIAGNOSIS — R0600 Dyspnea, unspecified: Secondary | ICD-10-CM

## 2015-10-10 DIAGNOSIS — E669 Obesity, unspecified: Secondary | ICD-10-CM

## 2015-10-10 LAB — BASIC METABOLIC PANEL
ANION GAP: 6 (ref 5–15)
BUN: 15 mg/dL (ref 6–20)
CHLORIDE: 100 mmol/L — AB (ref 101–111)
CO2: 32 mmol/L (ref 22–32)
CREATININE: 0.83 mg/dL (ref 0.61–1.24)
Calcium: 8.2 mg/dL — ABNORMAL LOW (ref 8.9–10.3)
GFR calc non Af Amer: >60 mL/min (ref >60)
Glucose, Bld: 144 mg/dL — ABNORMAL HIGH (ref 65–99)
POTASSIUM: 3.9 mmol/L (ref 3.5–5.1)
SODIUM: 138 mmol/L (ref 135–145)

## 2015-10-10 MED ORDER — TORSEMIDE 20 MG PO TABS: 10.0000 mg | ORAL_TABLET | Freq: Every day | ORAL | Status: DC

## 2015-10-10 MED ORDER — TORSEMIDE 20 MG PO TABS
10.0000 mg | ORAL_TABLET | Freq: Once | ORAL | Status: AC
  Administered 2015-10-10: 10 mg via ORAL
  Filled 2015-10-10: qty 1

## 2015-10-10 NOTE — Progress Notes (Signed)
Knee length XL - regular Ted hose applied.  Will wait about an hour to determine if these are comfortable and the right size.  Patient states he's worn these before.  Instructed him to decrease the size as his ankles decrease in size so he gets sufficient pressure to relieve and prevent fluid build up.

## 2015-10-10 NOTE — Progress Notes (Signed)
Discharge to home with family member.n Instructions given regarding fluid retention and edema.  Instructions provided about low salt diet.  Patient has used ted hose before.  Sent wearing XL reg knee highs.  Additional pair of L sent with him for use when his edema goes down.

## 2015-10-10 NOTE — Progress Notes (Signed)
Arrival Method: Via stretcher with ED tech Mental Orientation: A&O Telemetry: [id number] Skin: bruising to abdomen, abrasion to right lower shin, psoriasis  IV:  20g left AC Pain: no pain Tubes: O2 2L chronically Safety Measures: Safety Fall Prevention Plan has been given, discussed & signed, non skid socks in place. 2A Orientation: Patient has been orientated to the room, unit & staff.   Orders have been reviewed & implemented. Will continue to monitor the patient. Call light has been placed within reach.  Eden LatheLexi Miller, RN

## 2015-10-10 NOTE — Discharge Summary (Signed)
[facility] - [place] at [facility]   PATIENT NAME: Aaron Doyle    MR#:  [id number]  DATE OF BIRTH:  Jan 16, 1956  DATE OF ADMISSION:  10/09/2015 ADMITTING PHYSICIAN: Oralia Manis, MD  DATE OF DISCHARGE: 10/10/2015  2:19 PM  PRIMARY CARE PHYSICIAN: Emilio Math B, PA-C     ADMISSION DIAGNOSIS:  Acute on chronic diastolic heart failure (HCC) [I50.33] Peripheral edema [R60.9]  DISCHARGE DIAGNOSIS:  Principal Problem:   Acute on chronic diastolic CHF (congestive heart failure), NYHA class 1 (HCC) Active Problems:   Dyspnea   Leg swelling   Obesity   COPD (chronic obstructive pulmonary disease) (HCC)   HTN (hypertension)   Anemia   SECONDARY DIAGNOSIS:   Past Medical History  Diagnosis Date  . Mixed hyperlipidemia   . Hypertension   . Nonrheumatic mitral valve disorder   . Osteoarthritis of knee   . Chronic gout   . Colon polyp   . Glaucoma   . Obesity   . Shortness of breath   . CHF (congestive heart failure) (HCC)   . Bilateral lower extremity edema   . Obstructive sleep apnea   . Heart murmur   . Smoking   . Mild pulmonary hypertension (HCC)     .pro HOSPITAL COURSE:   Patient is 60 year old Caucasian male with past medical history significant for history of hyperlipidemia, hypertension, nonrheumatic mitral valve disorder, dyspnea, obesity, congestive heart failure, bilateral lower extremity edema, obstructive sleep apnea, suspected obesity hypoventilation syndrome, who presents to the hospital with complaints of lower extremity swelling, shortness of breath, weight gain. On arrival to the hospital cardiologist was consulted, patient was initiated on low dose of torsemide. With this, he diuresed approximately 2.3 L since admission and improved significantly. His repeat labs remained stable with normal kidney function. Patient was advised to continue low dose of torsemide at home and follow up with cardiologist for further  recommendations. Discussion by problem:  #1. Acute on chronic diastolic CHF, continue patient on torsemide low-dose, following kidney function, weight closely, follow up as outpatient with cardiologist. Patient lost approximately 2.3 L since admission to the hospital with not adversely affected kidney function, could be that this torsemide dose is sufficient for therapy. Patient's oxygen level was 95% on 2 L of oxygen through nasal cannula. On the day of discharge #2. Dyspnea, likely multifactorial, related to diastolic CHF, although chest x-ray failed fail to show significant fluid retention, also likely obstructive sleep apnea, obesity hypoventilation syndrome, patient improved with diuresis, he is to continue diuretic therapy as outpatient, patient was seen by heart failure clinic nursing staff, appointment was scheduled for 31st of July 2017 #3. Anemia, no active bleeding was noted, continue to follow. Anemia as outpatient  #4. Lower extremity swelling, patient was advised to wear compression stockings, he was also told to to wrap his legs with Kerlix if compression stockings are not effective.   DISCHARGE CONDITIONS:   Stable  CONSULTS OBTAINED:     DRUG ALLERGIES:  No Known Allergies  DISCHARGE MEDICATIONS:   Discharge Medication List as of 10/10/2015 11:46 AM    CONTINUE these medications which have CHANGED   Details  torsemide (DEMADEX) 20 MG tablet Take 0.5 tablets (10 mg total) by mouth daily., Starting 10/10/2015, Until Discontinued, Normal      CONTINUE these medications which have NOT CHANGED   Details  allopurinol (ZYLOPRIM) 300 MG tablet Take 300 mg by mouth at bedtime., Until Discontinued, Historical Med    aspirin EC 81  MG tablet Take 81 mg by mouth daily., Until Discontinued, Historical Med    atorvastatin (LIPITOR) 40 MG tablet Take 40 mg by mouth daily., Until Discontinued, Historical Med    carvedilol (COREG) 6.25 MG tablet Take 6.25 mg by mouth 2 (two) times  daily with a meal., Until Discontinued, Historical Med    mometasone-formoterol (DULERA) 100-5 MCG/ACT AERO Inhale 2 puffs into the lungs 2 (two) times daily., Starting 09/04/2015, Until Discontinued, Print    tiotropium (SPIRIVA) 18 MCG inhalation capsule Place 1 capsule (18 mcg total) into inhaler and inhale daily., Starting 09/04/2015, Until Discontinued, Print    Travoprost, BAK Free, (TRAVATAN) 0.004 % SOLN ophthalmic solution Place 1 drop into both eyes at bedtime., Until Discontinued, Historical Med         DISCHARGE INSTRUCTIONS:    Patient is to follow-up with primary care physician, cardiologist as outpatient, he is advised to follow his weight closely  If you experience worsening of your admission symptoms, develop shortness of breath, life threatening emergency, suicidal or homicidal thoughts you must seek medical attention immediately by calling 911 or calling your MD immediately  if symptoms less severe.  You Must read complete instructions/literature along with all the possible adverse reactions/side effects for all the Medicines you take and that have been prescribed to you. Take any new Medicines after you have completely understood and accept all the possible adverse reactions/side effects.   Please note  You were cared for by a hospitalist during your hospital stay. If you have any questions about your discharge medications or the care you received while you were in the hospital after you are discharged, you can call the unit and asked to speak with the hospitalist on call if the hospitalist that took care of you is not available. Once you are discharged, your primary care physician will handle any further medical issues. Please note that NO REFILLS for any discharge medications will be authorized once you are discharged, as it is imperative that you return to your primary care physician (or establish a relationship with a primary care physician if you do not have one) for your  aftercare needs so that they can reassess your need for medications and monitor your lab values.    Today   CHIEF COMPLAINT:   Chief Complaint  Patient presents with  . Shortness of Breath  . Leg Swelling    HISTORY OF PRESENT ILLNESS:  Aaron Doyle  is a 60 y.o. male with a known history of hyperlipidemia, hypertension, nonrheumatic mitral valve disorder, dyspnea, obesity, congestive heart failure, bilateral lower extremity edema, obstructive sleep apnea, suspected obesity hypoventilation syndrome, who presents to the hospital with complaints of lower extremity swelling, shortness of breath, weight gain. On arrival to the hospital cardiologist was consulted, patient was initiated on low dose of torsemide. With this, he diuresed approximately 2.3 L since admission and improved significantly. His repeat labs remained stable with normal kidney function. Patient was advised to continue low dose of torsemide at home and follow up with cardiologist for further recommendations. Discussion by problem:  #1. Acute on chronic diastolic CHF, continue patient on torsemide low-dose, following kidney function, weight closely, follow up as outpatient with cardiologist. Patient lost approximately 2.3 L since admission to the hospital with not adversely affected kidney function, could be that this torsemide dose is sufficient for therapy. Patient's oxygen level was 95% on 2 L of oxygen through nasal cannula. On the day of discharge #2. Dyspnea, likely multifactorial, related to diastolic  CHF, although chest x-ray failed fail to show significant fluid retention, also likely obstructive sleep apnea, obesity hypoventilation syndrome, patient improved with diuresis, he is to continue diuretic therapy as outpatient, patient was seen by heart failure clinic nursing staff, appointment was scheduled for 31st of July 2017 #3. Anemia, no active bleeding was noted, continue to follow. Anemia as outpatient  #4. Lower  extremity swelling, patient was advised to wear compression stockings, he was also told to to wrap his legs with Kerlix if compression stockings are not effective.    VITAL SIGNS:  Blood pressure 148/80, pulse 64, temperature 98.3 F (36.8 C), temperature source Oral, resp. rate 20, height 6' (1.829 m), weight 125.465 kg (276 lb 9.6 oz), SpO2 95 %.  I/O:   Intake/Output Summary (Last 24 hours) at 10/10/15 1424 Last data filed at 10/10/15 1341  Gross per 24 hour  Intake    243 ml  Output   2575 ml  Net  -2332 ml    PHYSICAL EXAMINATION:  GENERAL:  60 y.o.-year-old patient lying in the bed with no acute distress.  EYES: Pupils equal, round, reactive to light and accommodation. No scleral icterus. Extraocular muscles intact.  HEENT: Head atraumatic, normocephalic. Oropharynx and nasopharynx clear.  NECK:  Supple, no jugular venous distention. No thyroid enlargement, no tenderness.  LUNGS: Normal breath sounds bilaterally, no wheezing, rales,rhonchi or crepitation. No use of accessory muscles of respiration.  CARDIOVASCULAR: S1, S2 normal. No murmurs, rubs, or gallops.  ABDOMEN: Soft, non-tender, non-distended. Bowel sounds present. No organomegaly or mass.  EXTREMITIES: No pedal edema, cyanosis, or clubbing.  NEUROLOGIC: Cranial nerves II through XII are intact. Muscle strength 5/5 in all extremities. Sensation intact. Gait not checked.  PSYCHIATRIC: The patient is alert and oriented x 3.  SKIN: No obvious rash, lesion, or ulcer.   DATA REVIEW:   CBC  Recent Labs Lab 10/09/15 1940  WBC 8.2  HGB 12.6*  HCT 37.1*  PLT 285    Chemistries   Recent Labs Lab 10/10/15 1204  NA 138  K 3.9  CL 100*  CO2 32  GLUCOSE 144*  BUN 15  CREATININE 0.83  CALCIUM 8.2*    Cardiac Enzymes  Recent Labs Lab 10/09/15 1940  TROPONINI <0.03    Microbiology Results  Results for orders placed or performed during the hospital encounter of 09/26/15  MRSA PCR Screening     Status:  None   Collection Time: 09/26/15  4:47 PM  Result Value Ref Range Status   MRSA by PCR NEGATIVE NEGATIVE Final    Comment:        The GeneXpert MRSA Assay (FDA approved for NASAL specimens only), is one component of a comprehensive MRSA colonization surveillance program. It is not intended to diagnose MRSA infection nor to guide or monitor treatment for MRSA infections.     RADIOLOGY:  Dg Chest 2 View  10/09/2015  CLINICAL DATA:  60 year old male with shortness of breath for several months EXAM: CHEST  2 VIEW COMPARISON:  Chest radiograph dated 09/01/2015 FINDINGS: Two views of the chest demonstrate shallow inspiration with bibasilar subsegmental atelectatic changes. There is no focal consolidation. No pleural effusion or pneumothorax. The cardiac silhouette is within limits. No acute osseous pathology. IMPRESSION: No active cardiopulmonary disease. Electronically Signed   By: Elgie Collard M.D.   On: 10/09/2015 20:06    EKG:   Orders placed or performed during the hospital encounter of 10/09/15  . EKG 12-Lead  . EKG 12-Lead  . EKG  12-Lead  . EKG 12-Lead  . ED EKG within 10 minutes  . ED EKG within 10 minutes      Management plans discussed with the patient, family and they are in agreement.  CODE STATUS:     Code Status Orders        Start     Ordered   10/09/15 2358  Full code   Continuous     10/09/15 2357    Code Status History    Date Active Date Inactive Code Status Order ID Comments User Context   09/26/2015  4:41 PM 09/28/2015  5:52 PM Full Code [id number]  Katharina Caper, MD Inpatient   09/02/2015  1:02 AM 09/04/2015  8:24 PM Full Code [id number]  Oralia Manis, MD Inpatient      TOTAL TIME TAKING CARE OF THIS PATIENT: 40  minutes.    Katharina Caper M.D on 10/10/2015 at 2:24 PM  Between 7am to 6pm - Pager - [phone]  After 6pm go to [email or web address] - password [email or web address] [facility]  [place] [facility]  Office  [phone]  CC: Primary care physician;  Abran Richard, PA-C

## 2015-10-10 NOTE — Discharge Instructions (Signed)
Heart Failure Clinic appointment on October 27, 2015 at 12:30pm with Clarisa Kindredina Wynonia Medero, FNP. Please call [phone] to reschedule.

## 2015-10-10 NOTE — Progress Notes (Signed)
Initial Heart Failure Clinic appointment scheduled on October 27, 2015 at 12:30pm. Thank you.

## 2015-10-21 ENCOUNTER — Ambulatory Visit: Payer: Medicaid Other | Attending: Internal Medicine

## 2015-10-21 DIAGNOSIS — G4733 Obstructive sleep apnea (adult) (pediatric): Secondary | ICD-10-CM | POA: Diagnosis present

## 2015-10-27 ENCOUNTER — Ambulatory Visit: Payer: Medicaid Other | Admitting: Family

## 2015-11-03 ENCOUNTER — Inpatient Hospital Stay: Payer: Medicaid Other | Admitting: Internal Medicine

## 2016-10-16 ENCOUNTER — Emergency Department: Payer: Self-pay

## 2016-10-16 ENCOUNTER — Encounter: Payer: Self-pay | Admitting: Medical Oncology

## 2016-10-16 ENCOUNTER — Inpatient Hospital Stay: Payer: Self-pay

## 2016-10-16 ENCOUNTER — Inpatient Hospital Stay
Admission: EM | Admit: 2016-10-16 | Discharge: 2016-10-22 | DRG: 208 | Disposition: A | Discharge status: Home / Self Care | Payer: Self-pay | Attending: Internal Medicine | Admitting: Internal Medicine

## 2016-10-16 DIAGNOSIS — Z01818 Encounter for other preprocedural examination: Secondary | ICD-10-CM

## 2016-10-16 DIAGNOSIS — I5033 Acute on chronic diastolic (congestive) heart failure: Secondary | ICD-10-CM | POA: Diagnosis present

## 2016-10-16 DIAGNOSIS — J9621 Acute and chronic respiratory failure with hypoxia: Secondary | ICD-10-CM | POA: Diagnosis present

## 2016-10-16 DIAGNOSIS — F17210 Nicotine dependence, cigarettes, uncomplicated: Secondary | ICD-10-CM | POA: Diagnosis present

## 2016-10-16 DIAGNOSIS — G4733 Obstructive sleep apnea (adult) (pediatric): Secondary | ICD-10-CM | POA: Diagnosis present

## 2016-10-16 DIAGNOSIS — Z86010 Personal history of colonic polyps: Secondary | ICD-10-CM

## 2016-10-16 DIAGNOSIS — J9622 Acute and chronic respiratory failure with hypercapnia: Secondary | ICD-10-CM | POA: Diagnosis present

## 2016-10-16 DIAGNOSIS — I248 Other forms of acute ischemic heart disease: Secondary | ICD-10-CM | POA: Diagnosis present

## 2016-10-16 DIAGNOSIS — J9602 Acute respiratory failure with hypercapnia: Secondary | ICD-10-CM

## 2016-10-16 DIAGNOSIS — H409 Unspecified glaucoma: Secondary | ICD-10-CM | POA: Diagnosis present

## 2016-10-16 DIAGNOSIS — E876 Hypokalemia: Secondary | ICD-10-CM | POA: Diagnosis not present

## 2016-10-16 DIAGNOSIS — Z7982 Long term (current) use of aspirin: Secondary | ICD-10-CM

## 2016-10-16 DIAGNOSIS — R079 Chest pain, unspecified: Secondary | ICD-10-CM

## 2016-10-16 DIAGNOSIS — R0902 Hypoxemia: Secondary | ICD-10-CM

## 2016-10-16 DIAGNOSIS — J9600 Acute respiratory failure, unspecified whether with hypoxia or hypercapnia: Secondary | ICD-10-CM | POA: Diagnosis present

## 2016-10-16 DIAGNOSIS — Z79899 Other long term (current) drug therapy: Secondary | ICD-10-CM

## 2016-10-16 DIAGNOSIS — E662 Morbid (severe) obesity with alveolar hypoventilation: Secondary | ICD-10-CM | POA: Diagnosis present

## 2016-10-16 DIAGNOSIS — J449 Chronic obstructive pulmonary disease, unspecified: Secondary | ICD-10-CM | POA: Diagnosis present

## 2016-10-16 DIAGNOSIS — I110 Hypertensive heart disease with heart failure: Secondary | ICD-10-CM | POA: Diagnosis present

## 2016-10-16 DIAGNOSIS — J441 Chronic obstructive pulmonary disease with (acute) exacerbation: Principal | ICD-10-CM | POA: Diagnosis present

## 2016-10-16 DIAGNOSIS — M1A9XX0 Chronic gout, unspecified, without tophus (tophi): Secondary | ICD-10-CM | POA: Diagnosis present

## 2016-10-16 DIAGNOSIS — R0789 Other chest pain: Secondary | ICD-10-CM

## 2016-10-16 DIAGNOSIS — Z6836 Body mass index (BMI) 36.0-36.9, adult: Secondary | ICD-10-CM

## 2016-10-16 DIAGNOSIS — E782 Mixed hyperlipidemia: Secondary | ICD-10-CM | POA: Diagnosis present

## 2016-10-16 DIAGNOSIS — Z4659 Encounter for fitting and adjustment of other gastrointestinal appliance and device: Secondary | ICD-10-CM

## 2016-10-16 DIAGNOSIS — Z9981 Dependence on supplemental oxygen: Secondary | ICD-10-CM

## 2016-10-16 DIAGNOSIS — J9601 Acute respiratory failure with hypoxia: Secondary | ICD-10-CM

## 2016-10-16 LAB — BASIC METABOLIC PANEL
Anion gap: 13 (ref 5–15)
BUN: 13 mg/dL (ref 6–20)
CO2: 36 mmol/L — ABNORMAL HIGH (ref 22–32)
Calcium: 8.2 mg/dL — ABNORMAL LOW (ref 8.9–10.3)
Chloride: 89 mmol/L — ABNORMAL LOW (ref 101–111)
Creatinine, Ser: 0.82 mg/dL (ref 0.61–1.24)
Glucose, Bld: 141 mg/dL — ABNORMAL HIGH (ref 65–99)
POTASSIUM: 3 mmol/L — AB (ref 3.5–5.1)
SODIUM: 138 mmol/L (ref 135–145)

## 2016-10-16 LAB — BLOOD GAS, ARTERIAL
ACID-BASE EXCESS: 18.7 mmol/L — AB (ref 0.0–2.0)
Acid-Base Excess: 18.5 mmol/L — ABNORMAL HIGH (ref 0.0–2.0)
BICARBONATE: 50.1 mmol/L — AB (ref 20.0–28.0)
BICARBONATE: 44.3 mmol/L — AB (ref 20.0–28.0)
Expiratory PAP: 5
FIO2: 0.35
FIO2: 0.3
Inspiratory PAP: 16
MECHANICAL RATE: 8
MECHVT: 600 mL
Mechanical Rate: 20
Mode: POSITIVE
O2 SAT: 89.8 %
O2 Saturation: 94.1 %
PATIENT TEMPERATURE: 37
PATIENT TEMPERATURE: 37
PCO2 ART: 53 mmHg — AB (ref 32.0–48.0)
PEEP/CPAP: 5 cmH2O
PH ART: 7.53 — AB (ref 7.350–7.450)
PO2 ART: 76 mmHg — AB (ref 83.0–108.0)
pCO2 arterial: 95 mmHg (ref 32.0–48.0)
pH, Arterial: 7.33 — ABNORMAL LOW (ref 7.350–7.450)
pO2, Arterial: 51 mmHg — ABNORMAL LOW (ref 83.0–108.0)

## 2016-10-16 LAB — CBC
HEMATOCRIT: 40.8 % (ref 40.0–52.0)
Hemoglobin: 13.7 g/dL (ref 13.0–18.0)
MCH: 31.3 pg (ref 26.0–34.0)
MCHC: 33.5 g/dL (ref 32.0–36.0)
MCV: 93.4 fL (ref 80.0–100.0)
Platelets: 306 10*3/uL (ref 150–440)
RBC: 4.37 MIL/uL — AB (ref 4.40–5.90)
RDW: 13.6 % (ref 11.5–14.5)
WBC: 11.8 10*3/uL — AB (ref 3.8–10.6)

## 2016-10-16 LAB — TROPONIN I: Troponin I: 0.04 ng/mL (ref <0.03)

## 2016-10-16 LAB — BLOOD GAS, VENOUS
Acid-Base Excess: 18.2 mmol/L — ABNORMAL HIGH (ref 0.0–2.0)
Bicarbonate: 49.1 mmol/L — ABNORMAL HIGH (ref 20.0–28.0)
O2 Saturation: 88 %
PATIENT TEMPERATURE: 37
pCO2, Ven: 91 mmHg (ref 44.0–60.0)
pH, Ven: 7.34 (ref 7.250–7.430)
pO2, Ven: 58 mmHg — ABNORMAL HIGH (ref 32.0–45.0)

## 2016-10-16 LAB — BRAIN NATRIURETIC PEPTIDE: B Natriuretic Peptide: 204 pg/mL — ABNORMAL HIGH (ref 0.0–100.0)

## 2016-10-16 LAB — GLUCOSE, CAPILLARY: Glucose-Capillary: 195 mg/dL — ABNORMAL HIGH (ref 65–99)

## 2016-10-16 MED ORDER — ATORVASTATIN CALCIUM 20 MG PO TABS
40.0000 mg | ORAL_TABLET | Freq: Every day | ORAL | Status: DC
  Administered 2016-10-17 – 2016-10-18 (×3): 40 mg
  Filled 2016-10-16 (×3): qty 2

## 2016-10-16 MED ORDER — FENTANYL CITRATE (PF) 100 MCG/2ML IJ SOLN
100.0000 ug | Freq: Once | INTRAMUSCULAR | Status: AC
  Administered 2016-10-16: 100 ug via INTRAVENOUS

## 2016-10-16 MED ORDER — POTASSIUM CHLORIDE 10 MEQ/100ML IV SOLN
10.0000 meq | Freq: Once | INTRAVENOUS | Status: AC
  Administered 2016-10-16: 10 meq via INTRAVENOUS
  Filled 2016-10-16: qty 100

## 2016-10-16 MED ORDER — ROCURONIUM BROMIDE 50 MG/5ML IV SOLN
50.0000 mg | Freq: Once | INTRAVENOUS | Status: AC
Start: 2016-10-16 — End: 2016-10-16
  Administered 2016-10-16: 50 mg via INTRAVENOUS

## 2016-10-16 MED ORDER — ALLOPURINOL 300 MG PO TABS
300.0000 mg | ORAL_TABLET | Freq: Every day | ORAL | Status: DC
  Filled 2016-10-16: qty 1

## 2016-10-16 MED ORDER — BISACODYL 10 MG RE SUPP: 10.0000 mg | Freq: Every day | RECTAL | Status: DC | PRN

## 2016-10-16 MED ORDER — ROCURONIUM BROMIDE 50 MG/5ML IV SOLN
INTRAVENOUS | Status: AC
  Filled 2016-10-16: qty 1

## 2016-10-16 MED ORDER — IPRATROPIUM-ALBUTEROL 0.5-2.5 (3) MG/3ML IN SOLN
3.0000 mL | Freq: Once | RESPIRATORY_TRACT | Status: AC
  Administered 2016-10-16: 3 mL via RESPIRATORY_TRACT
  Filled 2016-10-16: qty 3

## 2016-10-16 MED ORDER — ATORVASTATIN CALCIUM 20 MG PO TABS: 40.0000 mg | ORAL_TABLET | Freq: Every day | ORAL | Status: DC

## 2016-10-16 MED ORDER — CHLORHEXIDINE GLUCONATE 0.12% ORAL RINSE (MEDLINE KIT)
15.0000 mL | Freq: Two times a day (BID) | OROMUCOSAL | Status: DC
  Administered 2016-10-16 – 2016-10-18 (×4): 15 mL via OROMUCOSAL

## 2016-10-16 MED ORDER — MIDAZOLAM HCL 2 MG/2ML IJ SOLN
4.0000 mg | Freq: Once | INTRAMUSCULAR | Status: AC
  Administered 2016-10-16: 4 mg via INTRAVENOUS
  Filled 2016-10-16: qty 4

## 2016-10-16 MED ORDER — METHYLPREDNISOLONE SODIUM SUCC 125 MG IJ SOLR
60.0000 mg | Freq: Four times a day (QID) | INTRAMUSCULAR | Status: DC
  Administered 2016-10-17 – 2016-10-19 (×10): 60 mg via INTRAVENOUS
  Filled 2016-10-16 (×10): qty 2

## 2016-10-16 MED ORDER — MIDAZOLAM HCL 2 MG/2ML IJ SOLN
2.0000 mg | INTRAMUSCULAR | Status: DC | PRN
  Administered 2016-10-16: 2 mg via INTRAVENOUS
  Filled 2016-10-16: qty 2

## 2016-10-16 MED ORDER — PIPERACILLIN-TAZOBACTAM 3.375 G IVPB
3.3750 g | Freq: Three times a day (TID) | INTRAVENOUS | Status: DC
  Administered 2016-10-16 – 2016-10-19 (×8): 3.375 g via INTRAVENOUS
  Filled 2016-10-16 (×8): qty 50

## 2016-10-16 MED ORDER — POTASSIUM CHLORIDE 10 MEQ/50ML IV SOLN
10.0000 meq | INTRAVENOUS | Status: DC | PRN
  Filled 2016-10-16: qty 50

## 2016-10-16 MED ORDER — FENTANYL CITRATE (PF) 100 MCG/2ML IJ SOLN: 50.0000 ug | Freq: Once | INTRAMUSCULAR | Status: AC

## 2016-10-16 MED ORDER — MAGNESIUM SULFATE 2 GM/50ML IV SOLN
INTRAVENOUS | Status: AC
  Administered 2016-10-16: 2 g via INTRAVENOUS
  Filled 2016-10-16: qty 50

## 2016-10-16 MED ORDER — FENTANYL CITRATE (PF) 100 MCG/2ML IJ SOLN
INTRAMUSCULAR | Status: AC
  Filled 2016-10-16: qty 2

## 2016-10-16 MED ORDER — MIDAZOLAM HCL 2 MG/2ML IJ SOLN
2.0000 mg | INTRAMUSCULAR | Status: DC | PRN
  Administered 2016-10-17: 2 mg via INTRAVENOUS
  Filled 2016-10-16: qty 2

## 2016-10-16 MED ORDER — LEVOFLOXACIN IN D5W 750 MG/150ML IV SOLN
750.0000 mg | Freq: Once | INTRAVENOUS | Status: AC
  Administered 2016-10-16: 750 mg via INTRAVENOUS
  Filled 2016-10-16: qty 150

## 2016-10-16 MED ORDER — CARVEDILOL 6.25 MG PO TABS
6.2500 mg | ORAL_TABLET | Freq: Two times a day (BID) | ORAL | Status: DC
  Administered 2016-10-17 – 2016-10-18 (×4): 6.25 mg
  Filled 2016-10-16 (×4): qty 1

## 2016-10-16 MED ORDER — ALBUTEROL SULFATE (2.5 MG/3ML) 0.083% IN NEBU
5.0000 mg | INHALATION_SOLUTION | RESPIRATORY_TRACT | Status: AC
  Administered 2016-10-16: 5 mg via RESPIRATORY_TRACT
  Filled 2016-10-16: qty 6

## 2016-10-16 MED ORDER — ASPIRIN EC 81 MG PO TBEC
81.0000 mg | DELAYED_RELEASE_TABLET | Freq: Every day | ORAL | Status: DC
  Administered 2016-10-17 – 2016-10-22 (×6): 81 mg via ORAL
  Filled 2016-10-16 (×6): qty 1

## 2016-10-16 MED ORDER — ASPIRIN 81 MG PO CHEW
324.0000 mg | CHEWABLE_TABLET | Freq: Once | ORAL | Status: AC
  Administered 2016-10-16: 324 mg via ORAL
  Filled 2016-10-16: qty 4

## 2016-10-16 MED ORDER — DOCUSATE SODIUM 100 MG PO CAPS: 100.0000 mg | ORAL_CAPSULE | Freq: Two times a day (BID) | ORAL | Status: DC

## 2016-10-16 MED ORDER — LATANOPROST 0.005 % OP SOLN
1.0000 [drp] | Freq: Every day | OPHTHALMIC | Status: DC
  Administered 2016-10-17 – 2016-10-21 (×6): 1 [drp] via OPHTHALMIC
  Filled 2016-10-16: qty 2.5

## 2016-10-16 MED ORDER — ONDANSETRON HCL 4 MG PO TABS: 4.0000 mg | ORAL_TABLET | Freq: Four times a day (QID) | ORAL | Status: DC | PRN

## 2016-10-16 MED ORDER — METHYLPREDNISOLONE SODIUM SUCC 125 MG IJ SOLR
125.0000 mg | INTRAMUSCULAR | Status: AC
  Administered 2016-10-16: 125 mg via INTRAVENOUS
  Filled 2016-10-16: qty 2

## 2016-10-16 MED ORDER — ACETAMINOPHEN 650 MG RE SUPP: 650.0000 mg | Freq: Four times a day (QID) | RECTAL | Status: DC | PRN

## 2016-10-16 MED ORDER — ACETAMINOPHEN 325 MG PO TABS: 650.0000 mg | ORAL_TABLET | Freq: Four times a day (QID) | ORAL | Status: DC | PRN

## 2016-10-16 MED ORDER — FUROSEMIDE 10 MG/ML IJ SOLN
40.0000 mg | Freq: Two times a day (BID) | INTRAMUSCULAR | Status: DC
  Administered 2016-10-17 – 2016-10-19 (×5): 40 mg via INTRAVENOUS
  Filled 2016-10-16 (×5): qty 4

## 2016-10-16 MED ORDER — CARVEDILOL 6.25 MG PO TABS: 6.2500 mg | ORAL_TABLET | Freq: Two times a day (BID) | ORAL | Status: DC

## 2016-10-16 MED ORDER — IPRATROPIUM-ALBUTEROL 0.5-2.5 (3) MG/3ML IN SOLN
3.0000 mL | Freq: Four times a day (QID) | RESPIRATORY_TRACT | Status: DC
  Administered 2016-10-17 – 2016-10-22 (×21): 3 mL via RESPIRATORY_TRACT
  Filled 2016-10-16 (×23): qty 3

## 2016-10-16 MED ORDER — MIDAZOLAM HCL 2 MG/2ML IJ SOLN
INTRAMUSCULAR | Status: AC
  Filled 2016-10-16: qty 4

## 2016-10-16 MED ORDER — ALLOPURINOL 300 MG PO TABS
300.0000 mg | ORAL_TABLET | Freq: Every day | ORAL | Status: DC
  Administered 2016-10-17 – 2016-10-18 (×2): 300 mg
  Filled 2016-10-16 (×2): qty 1

## 2016-10-16 MED ORDER — ENOXAPARIN SODIUM 40 MG/0.4ML ~~LOC~~ SOLN
40.0000 mg | SUBCUTANEOUS | Status: DC
  Administered 2016-10-16 – 2016-10-21 (×6): 40 mg via SUBCUTANEOUS
  Filled 2016-10-16 (×6): qty 0.4

## 2016-10-16 MED ORDER — POTASSIUM CHLORIDE 10 MEQ/100ML IV SOLN
10.0000 meq | Freq: Once | INTRAVENOUS | Status: AC
  Administered 2016-10-17: 10 meq via INTRAVENOUS
  Filled 2016-10-16: qty 100

## 2016-10-16 MED ORDER — PANTOPRAZOLE SODIUM 40 MG IV SOLR
40.0000 mg | Freq: Two times a day (BID) | INTRAVENOUS | Status: DC
  Administered 2016-10-16 – 2016-10-17 (×3): 40 mg via INTRAVENOUS
  Filled 2016-10-16 (×3): qty 40

## 2016-10-16 MED ORDER — MAGNESIUM SULFATE 2 GM/50ML IV SOLN
2.0000 g | Freq: Once | INTRAVENOUS | Status: AC
  Administered 2016-10-16: 2 g via INTRAVENOUS

## 2016-10-16 MED ORDER — FENTANYL BOLUS VIA INFUSION
50.0000 ug | INTRAVENOUS | Status: DC | PRN
  Administered 2016-10-16: 50 ug via INTRAVENOUS
  Filled 2016-10-16: qty 50

## 2016-10-16 MED ORDER — TIOTROPIUM BROMIDE MONOHYDRATE 18 MCG IN CAPS
18.0000 ug | ORAL_CAPSULE | Freq: Every day | RESPIRATORY_TRACT | Status: DC
  Filled 2016-10-16: qty 5

## 2016-10-16 MED ORDER — MOMETASONE FURO-FORMOTEROL FUM 100-5 MCG/ACT IN AERO
2.0000 | INHALATION_SPRAY | Freq: Two times a day (BID) | RESPIRATORY_TRACT | Status: DC
  Filled 2016-10-16: qty 8.8

## 2016-10-16 MED ORDER — ONDANSETRON HCL 4 MG/2ML IJ SOLN: 4.0000 mg | Freq: Four times a day (QID) | INTRAMUSCULAR | Status: DC | PRN

## 2016-10-16 MED ORDER — FUROSEMIDE 10 MG/ML IJ SOLN
60.0000 mg | INTRAMUSCULAR | Status: AC
  Administered 2016-10-16: 60 mg via INTRAVENOUS
  Filled 2016-10-16: qty 8

## 2016-10-16 MED ORDER — LORAZEPAM 2 MG/ML IJ SOLN: 0.5000 mg | INTRAMUSCULAR | Status: DC | PRN

## 2016-10-16 MED ORDER — FENTANYL 2500MCG IN NS 250ML (10MCG/ML) PREMIX INFUSION
25.0000 ug/h | INTRAVENOUS | Status: DC
  Administered 2016-10-16: 100 ug/h via INTRAVENOUS
  Administered 2016-10-17 (×2): 250 ug/h via INTRAVENOUS
  Filled 2016-10-16 (×3): qty 250

## 2016-10-16 MED ORDER — ORAL CARE MOUTH RINSE
15.0000 mL | OROMUCOSAL | Status: DC
  Administered 2016-10-17 – 2016-10-18 (×18): 15 mL via OROMUCOSAL

## 2016-10-16 NOTE — Progress Notes (Signed)
Vent changes made

## 2016-10-16 NOTE — H&P (Signed)
History and Physical    Aaron Doyle [id number] DOB: Feb 04, 1956 DOA: 10/16/2016  Referring physician: Dr. Fanny BienQuale PCP: Abran RichardBaucom, Jenny B, PA-C  Specialists: none  Chief Complaint: SOB  HPI: Aaron Doyle is a 61 y.o. male has a past medical history significant for HTN, OSA, morbid obesity, and COPD now with 3 day hx of worsening SOB with lethargy. In ER, pt hypoxic and hypercapneic requiring BiPAP. He is now admitted. No fever. Denies CP. Has worsening LE edema. CXR in ER shows no pneumonia but suggests mild CHF. He is now admitted.  Review of Systems: The patient denies anorexia, fever, weight loss,, vision loss, decreased hearing, hoarseness, chest pain, syncope, balance deficits, hemoptysis, abdominal pain, melena, hematochezia, severe indigestion/heartburn, hematuria, incontinence, genital sores, muscle weakness, suspicious skin lesions, transient blindness, difficulty walking, depression, unusual weight change, abnormal bleeding, enlarged lymph nodes, angioedema, and breast masses.   Past Medical History:  Diagnosis Date  . Bilateral lower extremity edema   . CHF (congestive heart failure) (HCC)   . Chronic gout   . Colon polyp   . Glaucoma   . Heart murmur   . Hypertension   . Mild pulmonary hypertension (HCC)   . Mixed hyperlipidemia   . Nonrheumatic mitral valve disorder   . Obesity   . Obstructive sleep apnea   . Osteoarthritis of knee   . Shortness of breath   . Smoking    Past Surgical History:  Procedure Laterality Date  . CARDIAC CATHETERIZATION N/A 06/24/2015   Procedure: Right/Left Heart Cath and Coronary Angiography;  Surgeon: Alwyn Peawayne D Callwood, MD;  Location: [facility] IN[facility]IVE [facility];  Service: Cardiovascular;  Laterality: N/A;  . CERVICAL FUSION     Social History:  reports that he has been smoking Cigarettes.  He has a 10.00 pack-year smoking history. He has never used smokeless tobacco. He reports that he drinks about 1.8 oz of alcohol per week . He reports  that he does not use drugs.  No Known Allergies  Family History  Problem Relation Age of Onset  . Arthritis Unknown   . Heart attack Unknown   . Multiple sclerosis Unknown     Prior to Admission medications   Medication Sig Start Date End Date Taking? Authorizing Provider  allopurinol (ZYLOPRIM) 300 MG tablet Take 300 mg by mouth at bedtime.    [provider]  aspirin EC 81 MG tablet Take 81 mg by mouth daily.    [provider]  atorvastatin (LIPITOR) 40 MG tablet Take 40 mg by mouth daily.    [provider]  carvedilol (COREG) 6.25 MG tablet Take 6.25 mg by mouth 2 (two) times daily with a meal.    [provider]  mometasone-formoterol (DULERA) 100-5 MCG/ACT AERO Inhale 2 puffs into the lungs 2 (two) times daily. 09/04/15   Hower, Cletis Athensavid K, MD  tiotropium (SPIRIVA) 18 MCG inhalation capsule Place 1 capsule (18 mcg total) into inhaler and inhale daily. 09/04/15   Hower, Cletis Athensavid K, MD  torsemide (DEMADEX) 20 MG tablet Take 0.5 tablets (10 mg total) by mouth daily. 10/10/15   Katharina CaperVaickute, Rima, MD  Travoprost, BAK Free, (TRAVATAN) 0.004 % SOLN ophthalmic solution Place 1 drop into both eyes at bedtime.    [provider]   Physical Exam: Vitals:   10/16/16 1719 10/16/16 1823 10/16/16 1830 10/16/16 1900  BP:  (!) 146/81 134/75 133/77  Pulse:  66 66 (!) 59  Resp:  (!) 40 (!) 32 19  Temp:  TempSrc:      SpO2:  96% 91% 91%  Weight: 120.2 kg (265 lb)     Height: 6' (1.829 m)        General:  Lethargic in acute distress, [place]/AT, obese  Eyes: PERRL, EOMI, no scleral icterus, conjunctiva clear  ENT: moist oropharynx without exudate, TM's benign, dentition fair  Neck: supple, no lymphadenopathy. No bruits or thyromegaly.  Cardiovascular: regular rate without MRG; 2+ peripheral pulses, no JVD, 2+ peripheral edema  Respiratory: decreased breath sounds with faint basilar rales. No wheezing or rhonchi. Respiratory effort increased  Abdomen:  soft, non tender to palpation, positive bowel sounds, no guarding, no rebound  Skin: no rashes or lesions  Musculoskeletal: normal bulk and tone, no joint swelling  Psychiatric: lethargic but arousable. Oriented X 3  Neurologic: CN 2-12 grossly intact, Motor strength 5/5 in all 4 groups with symmetric DTR's and non-focal sensory exam  Labs on Admission:  Basic Metabolic Panel:  Recent Labs Lab 10/16/16 1720  NA 138  K 3.0*  CL 89*  CO2 36*  GLUCOSE 141*  BUN 13  CREATININE 0.82  CALCIUM 8.2*   Liver Function Tests: No results for input(s): AST, ALT, ALKPHOS, BILITOT, PROT, ALBUMIN in the last 168 hours. No results for input(s): LIPASE, AMYLASE in the last 168 hours. No results for input(s): AMMONIA in the last 168 hours. CBC:  Recent Labs Lab 10/16/16 1720  WBC 11.8*  HGB 13.7  HCT 40.8  MCV 93.4  PLT 306   Cardiac Enzymes:  Recent Labs Lab 10/16/16 1720  TROPONINI 0.04*    BNP (last 3 results) No results for input(s): BNP in the last 8760 hours.  ProBNP (last 3 results) No results for input(s): PROBNP in the last 8760 hours.  CBG: No results for input(s): GLUCAP in the last 168 hours.  Radiological Exams on Admission: Dg Chest Port 1 View  Result Date: 10/16/2016 CLINICAL DATA:  Increasing dyspnea and bilateral lower extremity swelling over the past week. Chest pain. EXAM: PORTABLE CHEST 1 VIEW COMPARISON:  10/09/2015 FINDINGS: Crowding of interstitial lung markings due to low lung volumes, similar to that seen previously. Heart is borderline enlarged. No aortic aneurysm. No overt pulmonary edema. Patient's chin obscures apices. Chronic elevation of right hemidiaphragm. No acute nor suspicious osseous abnormality. IMPRESSION: Low lung volumes with bibasilar right greater than left atelectasis, similar to previous study. No active cardiopulmonary disease. Electronically Signed   By: Tollie Eth M.D.   On: 10/16/2016 17:48    EKG: Independently  reviewed.  Assessment/Plan Principal Problem:   Acute respiratory failure with hypoxia and hypercapnia (HCC) Active Problems:   COPD (chronic obstructive pulmonary disease) (HCC)   Acute on chronic diastolic CHF (congestive heart failure), NYHA class 1 (HCC)   OSA (obstructive sleep apnea)   Will admit to ICU as FULL Code on BiPAP. Begin IV Lasix with IV ABX and IV solumedrol. Echo ordered. Consult Pulmonology and Cardiology. Follow enzymes. Repeat labs in AM.  Diet: clear liquids Fluids: NS@KVO  DVT Prophylaxis: Lovenox  Code Status: FULL  Family Communication: yes  Disposition Plan: home  Time spent: 55 min

## 2016-10-16 NOTE — Consult Note (Signed)
Pharmacy Antibiotic Note  Clarise CruzClyde K Hoel is a 61 y.o. male admitted on 10/16/2016 with pneumonia.  Pharmacy has been consulted for zosyn dosing.  Plan: Zosyn 3.375g IV q8h (4 hour infusion).  Height: 6' (182.9 cm) Weight: 265 lb (120.2 kg) IBW/kg (Calculated) : 77.6  Temp (24hrs), Avg:99 F (37.2 C), Min:99 F (37.2 C), Max:99 F (37.2 C)   Recent Labs Lab 10/16/16 1720  WBC 11.8*  CREATININE 0.82    Estimated Creatinine Clearance: 125 mL/min (by C-G formula based on SCr of 0.82 mg/dL).    No Known Allergies  Antimicrobials this admission: Levofloxacin  7/21 >> one dose zsoyn 7/21 >>   Dose adjustments this admission:   Microbiology results:   Thank you for allowing pharmacy to be a part of this patient's care.  Olene FlossMelissa D Doratha Mcswain, Pharm.D, BCPS Clinical Pharmacist  10/16/2016 9:10 PM

## 2016-10-16 NOTE — Consult Note (Signed)
Name: Aaron Doyle MRN: [id number] DOB: 1955-09-26    ADMISSION DATE:  10/16/2016  CONSULTATION DATE: 10/16/16  REFERRING MD : Dr. Aram BeechamJeffrey Sparks  CHIEF COMPLAINT:  Shortness Of breath  BRIEF PATIENT DESCRIPTION: 61 yo male with COPD,CHF,morbid obesity came with shortness of breath. Was initially placed on BiPAP,Failed BiPAP therefore was intubated.  SIGNIFICANT EVENTS  7/21 Patient came with SOB and lethargy, Placed on BiPAP 7/21 Failed BiPAP 7/21 Intubated   STUDIES:  03/10/15 ECHO>>NORMAL LEFT VENTRICULAR SYSTOLIC FUNCTION WITH MILD LVH MODERATE VALVULAR REGURGITATION (See above) NO VALVULAR STENOSIS MODERATE PHTN EF >55%   HISTORY OF PRESENT ILLNESS:  Aaron Doyle is a [age] year old male with known history of CHF,COPD  Uses 2l of O2,OSA,HTN  And morbid obesity. Patient presents to ED on 7/21 with worsening shortness of breath over 3 days.  Patient was initially placed on BiPAP and his initial gas was 7.34/91/58/49.1( venous), an hour later his CO2 appeared to be worse with 7.33/95/76/50.1.  Patient was sent to the ICU on BiPAP.  Upon arrival to the unit, patient was very somnolent and barely responding to even painful stimuli therefore decided to intubate for respiratory insufficiency.  PAST MEDICAL HISTORY :   has a past medical history of Bilateral lower extremity edema; CHF (congestive heart failure) (HCC); Chronic gout; Colon polyp; Glaucoma; Heart murmur; Hypertension; Mild pulmonary hypertension (HCC); Mixed hyperlipidemia; Nonrheumatic mitral valve disorder; Obesity; Obstructive sleep apnea; Osteoarthritis of knee; Shortness of breath; and Smoking.  has a past surgical history that includes Cardiac catheterization (N/A, 06/24/2015) and Cervical fusion. Prior to Admission medications   Medication Sig Start Date End Date Taking? Authorizing Provider  allopurinol (ZYLOPRIM) 300 MG tablet Take 300 mg by mouth at bedtime.   Yes [provider]  aspirin EC 81 MG  tablet Take 81 mg by mouth daily.   Yes [provider]  atorvastatin (LIPITOR) 40 MG tablet Take 40 mg by mouth daily.   Yes [provider]  carvedilol (COREG) 6.25 MG tablet Take 6.25 mg by mouth 2 (two) times daily with a meal.   Yes [provider]  mometasone-formoterol (DULERA) 100-5 MCG/ACT AERO Inhale 2 puffs into the lungs 2 (two) times daily. 09/04/15  Yes Hower, Cletis Athensavid K, MD  tiotropium (SPIRIVA) 18 MCG inhalation capsule Place 1 capsule (18 mcg total) into inhaler and inhale daily. 09/04/15  Yes Hower, Cletis Athensavid K, MD  Travoprost, BAK Free, (TRAVATAN) 0.004 % SOLN ophthalmic solution Place 1 drop into both eyes at bedtime.   Yes [provider]  torsemide (DEMADEX) 20 MG tablet Take 0.5 tablets (10 mg total) by mouth daily. Patient not taking: Reported on 10/16/2016 10/10/15   Katharina CaperVaickute, Rima, MD   No Known Allergies  FAMILY HISTORY:  family history includes Arthritis in his unknown relative; Heart attack in his unknown relative; Multiple sclerosis in his unknown relative. SOCIAL HISTORY:  reports that he has been smoking Cigarettes.  He has a 10.00 pack-year smoking history. He has never used smokeless tobacco. He reports that he drinks about 1.8 oz of alcohol per week . He reports that he does not use drugs.  REVIEW OF SYSTEMS:   Constitutional: Negative for fever, chills, weight loss, malaise/fatigue and diaphoresis.  HENT: Negative for hearing loss, ear pain, nosebleeds, congestion, sore throat, neck pain, tinnitus and ear discharge.   Eyes: Negative for blurred vision, double vision, photophobia, pain, discharge and redness.  Respiratory: Negative for cough, hemoptysis, sputum production, shortness of breath, wheezing and stridor.  Cardiovascular: Negative for chest pain, palpitations, orthopnea, claudication, leg swelling and PND.  Gastrointestinal: Negative for heartburn, nausea, vomiting, abdominal pain, diarrhea, constipation, blood in stool and  melena.  Genitourinary: Negative for dysuria, urgency, frequency, hematuria and flank pain.  Musculoskeletal: Negative for myalgias, back pain, joint pain and falls.  Skin: Negative for itching and rash.  Neurological: Negative for dizziness, tingling, tremors, sensory change, speech change, focal weakness, seizures, loss of consciousness, weakness and headaches.  Endo/Heme/Allergies: Negative for environmental allergies and polydipsia. Does not bruise/bleed easily.  SUBJECTIVE: Unable to obtain as the patient is dyspnic and very lethargic  VITAL SIGNS: Temp:  [99 F (37.2 C)] 99 F (37.2 C) (07/21 1718) Pulse Rate:  [59-68] 61 (07/21 2013) Resp:  [19-40] 24 (07/21 2013) BP: (133-161)/(71-89) 134/71 (07/21 2013) SpO2:  [82 %-96 %] 92 % (07/21 2013) FiO2 (%):  [35 %] 35 % (07/21 2013) Weight:  [265 lb (120.2 kg)] 265 lb (120.2 kg) (07/21 1719)  PHYSICAL EXAMINATION: General: Morbidly obese, Caucasian male, in severe distress Neuro: very lethargic and somnolent HEENT: AT,Aaron Doyle, No jvd Cardiovascular: S1S2,regular, no m/r/g Lungs:  Diminished bibasilar, coarse, no crackles noted Abdomen: obese,distended,round Musculoskeletal:  +2 pitting edema BLE,  Skin: Scabs present all over the body   Recent Labs Lab 10/16/16 1720  NA 138  K 3.0*  CL 89*  CO2 36*  BUN 13  CREATININE 0.82  GLUCOSE 141*    Recent Labs Lab 10/16/16 1720  HGB 13.7  HCT 40.8  WBC 11.8*  PLT 306   Dg Chest Port 1 View  Result Date: 10/16/2016 CLINICAL DATA:  Increasing dyspnea and bilateral lower extremity swelling over the past week. Chest pain. EXAM: PORTABLE CHEST 1 VIEW COMPARISON:  10/09/2015 FINDINGS: Crowding of interstitial lung markings due to low lung volumes, similar to that seen previously. Heart is borderline enlarged. No aortic aneurysm. No overt pulmonary edema. Patient's chin obscures apices. Chronic elevation of right hemidiaphragm. No acute nor suspicious osseous abnormality. IMPRESSION:  Low lung volumes with bibasilar right greater than left atelectasis, similar to previous study. No active cardiopulmonary disease. Electronically Signed   By: Tollie Eth M.D.   On: 10/16/2016 17:48    ASSESSMENT / PLAN:    Acute on chronic hypoxic/hypercarbic  Respiratory failure secondary to COPD exacerbation Obesity Hypoventilation Syndrome Obstructive Sleep Apnea Diastolic Heart Failure Bilateral lower extremity edema Hypertension Hyperlipidemia Tobacco Abuse   Plan  Intubated 7/21, Vent settings established 30/5/20/600 VAP bundle implemented F/U ABG Routine CXR Continue steroids, taper F/U ECHO Received 60 mg of lasix X1 Continue diuresing with Lasix Continue levofloxacin and Zosyn Monitor fever,cbc Cardiology consulted Trend troponins Continue Aspirin Continue Lipitor Continue Bronchodilators Replace electrolytes per usual guidelines F/u BMET Follow Culture   Bincy Varughese,AG-ACNP Pulmonary and Critical Care Medicine [facility]   10/16/2016, 9:19 PM

## 2016-10-16 NOTE — ED Notes (Signed)
O2 up to 4L

## 2016-10-16 NOTE — Procedures (Signed)
Intubation Procedure Note Aaron Doyle [id number] [birth year]  Procedure: Intubation Indications: Respiratory insufficiency  Procedure Details Consent: Unable to obtain consent because of emergent medical necessity. Time Out: Verified patient identification, verified procedure, site/side was marked, verified correct patient position, special equipment/implants available, medications/allergies/relevent history reviewed, required imaging and test results available.  Performed  Drugs:  100 mcg Fentanyl, 4 mg Versed,  50mg  Rocuronium. DL I6.9x7.5 with #[id number]. Grade 1 view. ET tube visualized passing through vocal cords. Following intubation:  positive color change on ETCO2, condensation seen in endotracheal tube, equal breath sounds bilaterally.  Evaluation Hemodynamic Status: BP stable throughout; O2 sats: stable throughout Patient's Current Condition: stable Complications: No apparent complications Patient did tolerate procedure well. Chest X-ray ordered to verify placement.  CXR: pending.    Aaron Doyle,AG-ACNP Pulmonary & Critical Care

## 2016-10-16 NOTE — ED Provider Notes (Signed)
[facility] Emergency Department Provider Note   ____________________________________________   First MD Initiated Contact with Patient 10/16/16 1734     (approximate)  I have reviewed the triage vital signs and the nursing notes.   HISTORY  Chief Complaint Shortness of Breath and Leg Swelling  EM caveat:  limited due to patient's dyspnea and acute respiratory symptoms  HPI Aaron Doyle is a 61 y.o. male history of COPD and congestive heart failure  Patient speaking between breaths. Reports increased shortness of breath for about 1-2 weeks, worsening for the last couple of days. Slight discomfort across the chest today, but reports notable shortness of breath. Uses inhalers at home, reports wheezing, but also reports he recently ran out of torsemide and was placed best on Lasix which previously has not worked well for him.  His had increased swelling in the lower legs for the last several days. Feels like he is building up fluid everywhere and also wheezing. Slight cough. Denies fevers or chills. Mild discomfort across the mid chest. Nonradiating.   Past Medical History:  Diagnosis Date  . Bilateral lower extremity edema   . CHF (congestive heart failure) (HCC)   . Chronic gout   . Colon polyp   . Glaucoma   . Heart murmur   . Hypertension   . Mild pulmonary hypertension (HCC)   . Mixed hyperlipidemia   . Nonrheumatic mitral valve disorder   . Obesity   . Obstructive sleep apnea   . Osteoarthritis of knee   . Shortness of breath   . Smoking     Patient Active Problem List   Diagnosis Date Noted  . Leg swelling 10/10/2015  . Obesity 10/10/2015  . Dyspnea 10/10/2015  . Anemia 10/10/2015  . Acute renal failure (ARF) (HCC) 09/26/2015  . COPD (chronic obstructive pulmonary disease) (HCC) 09/01/2015  . HTN (hypertension) 09/01/2015  . Acute on chronic diastolic CHF (congestive heart failure), NYHA class 1 (HCC) 09/01/2015  . OSA  (obstructive sleep apnea) 09/01/2015    Past Surgical History:  Procedure Laterality Date  . CARDIAC CATHETERIZATION N/A 06/24/2015   Procedure: Right/Left Heart Cath and Coronary Angiography;  Surgeon: Alwyn Pea, MD;  Location: [facility] INVASIVE CV LAB;  Service: Cardiovascular;  Laterality: N/A;  . CERVICAL FUSION      Prior to Admission medications   Medication Sig Start Date End Date Taking? Authorizing Provider  allopurinol (ZYLOPRIM) 300 MG tablet Take 300 mg by mouth at bedtime.    [provider]  aspirin EC 81 MG tablet Take 81 mg by mouth daily.    [provider]  atorvastatin (LIPITOR) 40 MG tablet Take 40 mg by mouth daily.    [provider]  carvedilol (COREG) 6.25 MG tablet Take 6.25 mg by mouth 2 (two) times daily with a meal.    [provider]  mometasone-formoterol (DULERA) 100-5 MCG/ACT AERO Inhale 2 puffs into the lungs 2 (two) times daily. 09/04/15   Hower, [name], MD  tiotropium (SPIRIVA) 18 MCG inhalation capsule Place 1 capsule (18 mcg total) into inhaler and inhale daily. 09/04/15   Hower, [name], MD  torsemide (DEMADEX) 20 MG tablet Take 0.5 tablets (10 mg total) by mouth daily. 10/10/15   Katharina Caper, MD  Travoprost, BAK Free, (TRAVATAN) 0.004 % SOLN ophthalmic solution Place 1 drop into both eyes at bedtime.    [provider]    Allergies Patient has no known allergies.  Family History  Problem Relation  Age of Onset  . Arthritis Unknown   . Heart attack Unknown   . Multiple sclerosis Unknown     Social History Social History  Substance Use Topics  . Smoking status: Current Every Day Smoker    Packs/day: 0.25    Years: 40.00    Types: Cigarettes  . Smokeless tobacco: Not on file  . Alcohol use 1.8 oz/week    3 Shots of liquor per week    Review of SystemsEM caveat Constitutional: No fever/chills Cardiovascular: See history of present illness Respiratory: See history of present illness  Gastrointestinal: No abdominal pain. Genitourinary: Negative for dysuria. Musculoskeletal: Negative for back pain. Skin: Negative for rash. Neurological: Negative for headaches, focal weakness or numbness.    ____________________________________________   PHYSICAL EXAM:  VITAL SIGNS: ED Triage Vitals  Enc Vitals Group     BP 10/16/16 1718 (!) 161/71     Pulse Rate 10/16/16 1718 68     Resp 10/16/16 1718 (!) 25     Temp 10/16/16 1718 99 F (37.2 C)     Temp Source 10/16/16 1718 Oral     SpO2 10/16/16 1718 (!) 82 %     Weight 10/16/16 1719 265 lb (120.2 kg)     Height 10/16/16 1719 6' (1.829 m)     Head Circumference --      Peak Flow --      Pain Score 10/16/16 1718 4     Pain Loc --      Pain Edu? --      Excl. in GC? --     Constitutional: Alert and oriented. Sitting upright, slightly draining 4, cyanotic appearance to the hands and face. Appears in moderate  respiratory distress. Eyes: Conjunctivae are normal. Head: Atraumatic. Nose: No congestion/rhinnorhea. Mouth/Throat: Mucous membranes are moist. Neck: No stridor.   Cardiovascular: Normal rate, regular rhythm. Grossly normal heart sounds.  Good peripheral circulation. Respiratory: Moderate increased work of breathing. Sitting upright. In distress. Accessory muscle use. Speaks one to 2 word sentences Gastrointestinal: Soft and nontender. No distention. Musculoskeletal: No lower extremity tenderness with 3+ pitting edema bilateral bordering on anasarca Neurologic:  Normal speech and language. No gross focal neurologic deficits are appreciated.  Skin:  Skin is cool dry and intact. No rash noted. Psychiatric: Mood and affect are normal. Speech and behavior are normal.  ____________________________________________   LABS (all labs ordered are listed, but only abnormal results are displayed)  Labs Reviewed  BASIC METABOLIC PANEL - Abnormal; Notable for the following:       Result Value   Potassium 3.0 (*)     Chloride 89 (*)    CO2 36 (*)    Glucose, Bld 141 (*)    Calcium 8.2 (*)    All other components within normal limits  CBC - Abnormal; Notable for the following:    WBC 11.8 (*)    RBC 4.37 (*)    All other components within normal limits  TROPONIN I - Abnormal; Notable for the following:    Troponin I 0.04 (*)    All other components within normal limits  BLOOD GAS, VENOUS - Abnormal; Notable for the following:    pCO2, Ven 91 (*)    pO2, Ven 58.0 (*)    Bicarbonate 49.1 (*)    Acid-Base Excess 18.2 (*)    All other components within normal limits  BRAIN NATRIURETIC PEPTIDE   ____________________________________________  EKG  Reviewed and interpreted by me at 1735 Heart rate 65 QRS 160  QTc 440 Right bundle-branch block, inferior T wave inversions, depressions also noted in V4 through V6 concerning for possible ischemia ____________________________________________  RADIOLOGY  Dg Chest Port 1 View  Result Date: 10/16/2016 CLINICAL DATA:  Increasing dyspnea and bilateral lower extremity swelling over the past week. Chest pain. EXAM: PORTABLE CHEST 1 VIEW COMPARISON:  10/09/2015 FINDINGS: Crowding of interstitial lung markings due to low lung volumes, similar to that seen previously. Heart is borderline enlarged. No aortic aneurysm. No overt pulmonary edema. Patient's chin obscures apices. Chronic elevation of right hemidiaphragm. No acute nor suspicious osseous abnormality. IMPRESSION: Low lung volumes with bibasilar right greater than left atelectasis, similar to previous study. No active cardiopulmonary disease. Electronically Signed   By: Tollie Eth M.D.   On: 10/16/2016 17:48    ____________________________________________   PROCEDURES  Procedure(s) performed: None  Procedures  Critical Care performed: Yes, see critical care note(s)  CRITICAL CARE Performed by: Sharyn Creamer   Total critical care time: 40 minutes  Critical care time was exclusive of separately  billable procedures and treating other patients.  Critical care was necessary to treat or prevent imminent or life-threatening deterioration.  Critical care was time spent personally by me on the following activities: development of treatment plan with patient and/or surrogate as well as nursing, discussions with consultants, evaluation of patient's response to treatment, examination of patient, obtaining history from patient or surrogate, ordering and performing treatments and interventions, ordering and review of laboratory studies, ordering and review of radiographic studies, pulse oximetry and re-evaluation of patient's condition.  ____________________________________________   INITIAL IMPRESSION / ASSESSMENT AND PLAN / ED COURSE  Pertinent labs & imaging results that were available during my care of the patient were reviewed by me and considered in my medical decision making (see chart for details).  Patient transfer shortness of breath. Reports slight chest discomfort, EKG with T-wave inversions noted and slightly elevated troponin. However is symptom  seem to suggest a primary pulmonary source or possibly CHF. No evidence of STEMI  Chest x-ray reviewed, no obvious infiltrate or pulmonary edema. Suspected this point likely COPD exacerbation, significant bili elevated PCO2. We'll treat with BiPAP, steroids, nebulizer therapy. Admit to the hospital given the acuity, work of breathing, and associated hypoxia  Clinical Course as of Oct 17 1843  Sat Oct 16, 2016  1820 Patient tolerating BiPAP well. Resting, but alerts to voice. Reports he's breathing better. Taking good ventilations about 10-12 L/m on BiPAP. Appears to be improving and reports improvement.  [MQ]    Clinical Course User Index [MQ] Sharyn Creamer, MD    ----------------------------------------- 5:48 PM on 10/16/2016 -----------------------------------------  Patient on BiPAP. Tolerating well. Improvement in oxygen  saturation. Work of breathing improving.  ----------------------------------------- 6:44 PM on 10/16/2016 -----------------------------------------  Admitting the hospital. Discussed case with Dr. Judithann Sheen. Patient agreeable. Continue to tolerate BiPAP well. Currently saturating in the mid to low 90s. Resting, easily alerts and taking good volumes. ____________________________________________   FINAL CLINICAL IMPRESSION(S) / ED DIAGNOSES  Final diagnoses:  Chest pain  Acute on chronic diastolic CHF (congestive heart failure), NYHA class 1 (HCC)      NEW MEDICATIONS STARTED DURING THIS VISIT:  New Prescriptions   No medications on file     Note:  This document was prepared using Dragon voice recognition software and may include unintentional dictation errors.     Sharyn Creamer, MD 10/16/16 [phone]

## 2016-10-16 NOTE — ED Notes (Signed)
Report finished to MelmoreMisty, RN att

## 2016-10-16 NOTE — ED Triage Notes (Signed)
Pt reports that for the past week he has been noticing increased swelling to BLE and having increase sob. Pt usually wears home o2 2L today he is on 4L with sats in low to mid 80's. Pt reports some chest pain that comes and goes that began today.

## 2016-10-17 ENCOUNTER — Inpatient Hospital Stay
Admit: 2016-10-17 | Discharge: 2016-10-17 | Disposition: A | Discharge status: Home / Self Care | Payer: Self-pay | Attending: Internal Medicine | Admitting: Internal Medicine

## 2016-10-17 DIAGNOSIS — J441 Chronic obstructive pulmonary disease with (acute) exacerbation: Principal | ICD-10-CM

## 2016-10-17 DIAGNOSIS — J9602 Acute respiratory failure with hypercapnia: Secondary | ICD-10-CM

## 2016-10-17 DIAGNOSIS — G4733 Obstructive sleep apnea (adult) (pediatric): Secondary | ICD-10-CM

## 2016-10-17 DIAGNOSIS — J9601 Acute respiratory failure with hypoxia: Secondary | ICD-10-CM

## 2016-10-17 LAB — BLOOD GAS, ARTERIAL
Acid-Base Excess: 21.5 mmol/L — ABNORMAL HIGH (ref 0.0–2.0)
BICARBONATE: 48.8 mmol/L — AB (ref 20.0–28.0)
FIO2: 0.5
MECHANICAL RATE: 16
O2 SAT: 94.9 %
PATIENT TEMPERATURE: 37
PCO2 ART: 64 mmHg — AB (ref 32.0–48.0)
PEEP/CPAP: 5 cmH2O
PH ART: 7.49 — AB (ref 7.350–7.450)
PO2 ART: 69 mmHg — AB (ref 83.0–108.0)
VT: 600 mL

## 2016-10-17 LAB — COMPREHENSIVE METABOLIC PANEL
ALT: 19 U/L (ref 17–63)
ANION GAP: 12 (ref 5–15)
AST: 24 U/L (ref 15–41)
Albumin: 3 g/dL — ABNORMAL LOW (ref 3.5–5.0)
Alkaline Phosphatase: 82 U/L (ref 38–126)
BUN: 14 mg/dL (ref 6–20)
CALCIUM: 8 mg/dL — AB (ref 8.9–10.3)
CHLORIDE: 89 mmol/L — AB (ref 101–111)
CO2: 37 mmol/L — AB (ref 22–32)
CREATININE: 0.93 mg/dL (ref 0.61–1.24)
Glucose, Bld: 191 mg/dL — ABNORMAL HIGH (ref 65–99)
Potassium: 3.2 mmol/L — ABNORMAL LOW (ref 3.5–5.1)
SODIUM: 138 mmol/L (ref 135–145)
Total Bilirubin: 0.7 mg/dL (ref 0.3–1.2)
Total Protein: 6.7 g/dL (ref 6.5–8.1)

## 2016-10-17 LAB — MRSA PCR SCREENING: MRSA by PCR: NEGATIVE

## 2016-10-17 LAB — CBC
HCT: 38.5 % — ABNORMAL LOW (ref 40.0–52.0)
HEMOGLOBIN: 13.1 g/dL (ref 13.0–18.0)
MCH: 31.7 pg (ref 26.0–34.0)
MCHC: 34 g/dL (ref 32.0–36.0)
MCV: 93.2 fL (ref 80.0–100.0)
PLATELETS: 248 10*3/uL (ref 150–440)
RBC: 4.13 MIL/uL — AB (ref 4.40–5.90)
RDW: 13.7 % (ref 11.5–14.5)
WBC: 9.6 10*3/uL (ref 3.8–10.6)

## 2016-10-17 LAB — TROPONIN I
TROPONIN I: 0.03 ng/mL — AB (ref <0.03)
Troponin I: 0.03 ng/mL (ref <0.03)
Troponin I: <0.03 ng/mL (ref <0.03)

## 2016-10-17 LAB — GLUCOSE, CAPILLARY: GLUCOSE-CAPILLARY: 183 mg/dL — AB (ref 65–99)

## 2016-10-17 MED ORDER — POTASSIUM CHLORIDE CRYS ER 20 MEQ PO TBCR: 20.0000 meq | EXTENDED_RELEASE_TABLET | ORAL | Status: DC | PRN

## 2016-10-17 MED ORDER — POTASSIUM CHLORIDE 10 MEQ/100ML IV SOLN
10.0000 meq | INTRAVENOUS | Status: AC
Start: 2016-10-17 — End: 2016-10-17
  Administered 2016-10-17 (×6): 10 meq via INTRAVENOUS
  Filled 2016-10-17 (×6): qty 100

## 2016-10-17 MED ORDER — POTASSIUM CHLORIDE 10 MEQ/50ML IV SOLN: 10.0000 meq | INTRAVENOUS | Status: DC | PRN

## 2016-10-17 MED ORDER — DOCUSATE SODIUM 50 MG/5ML PO LIQD
100.0000 mg | Freq: Two times a day (BID) | ORAL | Status: DC
  Administered 2016-10-17 – 2016-10-18 (×4): 100 mg
  Filled 2016-10-17 (×4): qty 10

## 2016-10-17 NOTE — Progress Notes (Signed)
Chaplain received OR with family's request for prayer. CH visited patient and provided silent prayer as patient was not awake and no family was present.     10/17/16 1405  Clinical Encounter Type  Visited With Patient  Visit Type Initial;Spiritual support  Referral From Nurse  Consult/Referral To Chaplain  Spiritual Encounters  Spiritual Needs Prayer

## 2016-10-17 NOTE — Clinical Social Work Note (Signed)
The CSW received a consult for home health needs. The CSW does not assist with home health; this role is the purview of RNCM. Please consult RNCM for home health needs.  Aaron PonderKaren Martha Keisi Eckford, MSW, Theresia MajorsLCSWA [phone]

## 2016-10-17 NOTE — Progress Notes (Signed)
[employer] -  at [facility]   PATIENT NAME: Aaron Doyle    MR#:  [id number]  DATE OF BIRTH:  08-27-1955  SUBJECTIVE:  CHIEF COMPLAINT:   Chief Complaint  Patient presents with  . Shortness of Breath  . Leg Swelling   Came with respiratory failure and lethargy, was on BiPAP on admission. Overnight due to worsening intubated and currently on ventilatory support.  REVIEW OF SYSTEMS:   Patient is intubated.  ROS  DRUG ALLERGIES:  No Known Allergies  VITALS:  Blood pressure 125/65, pulse (!) 58, temperature 98.8 F (37.1 C), resp. rate 19, height 6' (1.829 m), weight 122.4 kg (269 lb 13.5 oz), SpO2 93 %.  PHYSICAL EXAMINATION:  GENERAL:  61 y.o.-year-old patient lying in the bed with Critical appearance.  EYES: Pupils equal, round, reactive to light and accommodation. No scleral icterus. Extraocular muscles intact.  HEENT: Head atraumatic, normocephalic. Oropharynx and nasopharynx clear.  NECK:  Supple, no jugular venous distention. No thyroid enlargement, no tenderness.  LUNGS: Normal breath sounds bilaterally, no wheezing, rales,rhonchi or crepitation. No use of accessory muscles of respiration. Ventilatory support. CARDIOVASCULAR: S1, S2 normal. No murmurs, rubs, or gallops.  ABDOMEN: Soft, nontender, nondistended. Bowel sounds present. No organomegaly or mass.  EXTREMITIES: No pedal edema, cyanosis, or clubbing.  NEUROLOGIC: Patient is sedated on ventilatory support. PSYCHIATRIC: The patient is sedated.  SKIN: No obvious rash, lesion, or ulcer.   Physical Exam LABORATORY PANEL:   CBC  Recent Labs Lab 10/17/16 0517  WBC 9.6  HGB 13.1  HCT 38.5*  PLT 248   ------------------------------------------------------------------------------------------------------------------  Chemistries   Recent Labs Lab 10/17/16 0517  NA 138  K 3.2*  CL 89*  CO2 37*  GLUCOSE 191*  BUN 14  CREATININE 0.93  CALCIUM 8.0*  AST 24  ALT 19  ALKPHOS  82  BILITOT 0.7   ------------------------------------------------------------------------------------------------------------------  Cardiac Enzymes  Recent Labs Lab 10/17/16 0517 10/17/16 1111  TROPONINI 0.03* <0.03   ------------------------------------------------------------------------------------------------------------------  RADIOLOGY:  Dg Chest 1 View  Result Date: 10/16/2016 CLINICAL DATA:  Encounter for intubation EXAM: CHEST 1 VIEW COMPARISON:  10/16/2016 at 1729 hours FINDINGS: Endotracheal tube tip is 3.9 cm above the carina. Gastric tube with end and side ports are noted in the expected location of the stomach. The heart is enlarged. Low lung volumes with crowding of interstitial lung markings and bibasilar atelectasis. Probable small left greater than right pleural effusions blunting the costophrenic angles. No overt pulmonary edema. Tortuous ectatic appearance of the thoracic aorta with atherosclerosis. ACDF of the included lower cervical spine. IMPRESSION: 1. Satisfactory support line and tube positions. 2. Low lung volumes with atelectasis. Small left greater than right pleural effusions. 3. Aortic atherosclerosis. Electronically Signed   By: Tollie Eth M.D.   On: 10/16/2016 22:19   Dg Abd 1 View  Result Date: 10/16/2016 CLINICAL DATA:  Nasogastric tube placement, initial encounter EXAM: ABDOMEN - 1 VIEW COMPARISON:  CT abdomen and pelvis 12/28/2014 FINDINGS: The tip and side port of a gastric tube are noted in the body of the stomach. Bowel gas pattern is unremarkable apart from increased colonic stool burden along the ascending and proximal transverse colon and to a lesser degree the proximal descending colon. Dextroconvex curvature of the lumbar spine with degenerative disc disease and osteophytes consistent with spondylosis. No radio-opaque calculi. No free air. IMPRESSION: Satisfactory gastric tube tip position within the body of the stomach. Electronically Signed    By: Rene Kocher.D.  On: 10/16/2016 22:21   Dg Chest Port 1 View  Result Date: 10/16/2016 CLINICAL DATA:  Increasing dyspnea and bilateral lower extremity swelling over the past week. Chest pain. EXAM: PORTABLE CHEST 1 VIEW COMPARISON:  10/09/2015 FINDINGS: Crowding of interstitial lung markings due to low lung volumes, similar to that seen previously. Heart is borderline enlarged. No aortic aneurysm. No overt pulmonary edema. Patient's chin obscures apices. Chronic elevation of right hemidiaphragm. No acute nor suspicious osseous abnormality. IMPRESSION: Low lung volumes with bibasilar right greater than left atelectasis, similar to previous study. No active cardiopulmonary disease. Electronically Signed   By: Tollie Ethavid  Kwon M.D.   On: 10/16/2016 17:48    ASSESSMENT AND PLAN:   Principal Problem:   Acute respiratory failure with hypoxia and hypercapnia (HCC) Active Problems:   COPD (chronic obstructive pulmonary disease) (HCC)   Acute on chronic diastolic CHF (congestive heart failure), NYHA class 1 (HCC)   OSA (obstructive sleep apnea)   Acute respiratory failure (HCC)   Encounter for intubation  * Acute respiratory failure with hypoxia and hypercapnia   Acute COPD exacerbation   Acute on chronic diastolic CHF    Status post intubation, manage per intensivist. Continue Solu-Medrol, bronchodilators, Zosyn.   IV Lasix. Echocardiogram ordered.  * Hypertension    continue carvedilol.  * Hypokalemia   Replace per ICU protocol.  All the records are reviewed and case discussed with Care Management/Social Workerr. Management plans discussed with the patient, family and they are in agreement.  CODE STATUS: Full.  TOTAL TIME TAKING CARE OF THIS PATIENT: 35 minutes.     POSSIBLE D/C IN 2-3 DAYS, DEPENDING ON CLINICAL CONDITION.   Altamese DillingVACHHANI, Clinton Dragone M.D on 10/17/2016   Between 7am to 6pm - Pager - [phone]  After 6pm go to [email or web address] - password [email or web address] [facility]  [facility]  Office  [phone]  CC: Primary care physician; Abran RichardBaucom, Jenny B, PA-C  Note: This dictation was prepared with Dragon dictation along with smaller phrase technology. Any transcriptional errors that result from this process are unintentional.

## 2016-10-17 NOTE — Consult Note (Signed)
[facility]  CARDIOLOGY CONSULT NOTE  Patient ID: Aaron Doyle MRN: [id number] DOB/AGE: 1955-06-17 61 y.o.  Admit date: 10/16/2016 Referring Physician Sparks Primary Physician Baucom Primary Cardiologist  Reason for Consultation Borderline elevated troponin secondary to demand supply ischemia  HPI: 61 year old gentleman referred for evaluation of elevated troponin. The patient resides with three-day history of lethargy, progressive dyspnea, hypoxia, intubated, on mechanical ventilator. Admission ECG was nondiagnostic. Admission labs were notable for borderline elevated troponin of 0.03, 0.03, in the absence of chest pain or new ECG changes. He has no prior cardiac history.  Review of systems complete and found to be negative unless listed above     Past Medical History:  Diagnosis Date  . Bilateral lower extremity edema   . CHF (congestive heart failure) (HCC)   . Chronic gout   . Colon polyp   . Glaucoma   . Heart murmur   . Hypertension   . Mild pulmonary hypertension (HCC)   . Mixed hyperlipidemia   . Nonrheumatic mitral valve disorder   . Obesity   . Obstructive sleep apnea   . Osteoarthritis of knee   . Shortness of breath   . Smoking     Past Surgical History:  Procedure Laterality Date  . CARDIAC CATHETERIZATION N/A 06/24/2015   Procedure: Right/Left Heart Cath and Coronary Angiography;  Surgeon: Alwyn Pea, MD;  Location: [facility] INVASIVE CV LAB;  Service: Cardiovascular;  Laterality: N/A;  . CERVICAL FUSION      Prescriptions Prior to Admission  Medication Sig Dispense Refill Last Dose  . allopurinol (ZYLOPRIM) 300 MG tablet Take 300 mg by mouth at bedtime.   10/15/2016 at 2000  . aspirin EC 81 MG tablet Take 81 mg by mouth daily.   10/16/2016 at 0800  . atorvastatin (LIPITOR) 40 MG tablet Take 40 mg by mouth daily.   10/16/2016 at Unknown time  . carvedilol (COREG) 6.25 MG tablet Take 6.25 mg by mouth 2 (two) times daily with a meal.   10/16/2016 at 0800  .  mometasone-formoterol (DULERA) 100-5 MCG/ACT AERO Inhale 2 puffs into the lungs 2 (two) times daily. 13 g 0 10/16/2016 at 0800  . tiotropium (SPIRIVA) 18 MCG inhalation capsule Place 1 capsule (18 mcg total) into inhaler and inhale daily. 30 capsule 12 10/16/2016 at 0800  . Travoprost, BAK Free, (TRAVATAN) 0.004 % SOLN ophthalmic solution Place 1 drop into both eyes at bedtime.   10/15/2016 at 2000  . torsemide (DEMADEX) 20 MG tablet Take 0.5 tablets (10 mg total) by mouth daily. (Patient not taking: Reported on 10/16/2016) 30 tablet 5 Not Taking   Social History   Social History  . Marital status: Single    Spouse name: N/A  . Number of children: N/A  . Years of education: N/A   Occupational History  . Not on file.   Social History Main Topics  . Smoking status: Current Every Day Smoker    Packs/day: 0.25    Years: 40.00    Types: Cigarettes  . Smokeless tobacco: Never Used  . Alcohol use 1.8 oz/week    3 Shots of liquor per week  . Drug use: No  . Sexual activity: Not on file   Other Topics Concern  . Not on file   Social History Narrative  . No narrative on file    Family History  Problem Relation Age of Onset  . Arthritis Unknown   . Heart attack Unknown   . Multiple sclerosis Unknown  Review of systems complete and found to be negative unless listed above      PHYSICAL EXAM  General: Well developed, well nourished, in no acute distress HEENT:  Normocephalic and atramatic Neck:  No JVD.  Lungs: Clear bilaterally to auscultation and percussion. Heart: HRRR . Normal S1 and S2 without gallops or murmurs.  Abdomen: Bowel sounds are positive, abdomen soft and non-tender  Msk:  Back normal, normal gait. Normal strength and tone for age. Extremities: No clubbing, cyanosis or edema.   Neuro: Alert and oriented X 3. Psych:  Good affect, responds appropriately  Labs:   Lab Results  Component Value Date   WBC 9.6 10/17/2016   HGB 13.1 10/17/2016   HCT 38.5  (L) 10/17/2016   MCV 93.2 10/17/2016   PLT 248 10/17/2016    Recent Labs Lab 10/17/16 0517  NA 138  K 3.2*  CL 89*  CO2 37*  BUN 14  CREATININE 0.93  CALCIUM 8.0*  PROT 6.7  BILITOT 0.7  ALKPHOS 82  ALT 19  AST 24  GLUCOSE 191*   Lab Results  Component Value Date   TROPONINI 0.03 (HH) 10/17/2016   No results found for: CHOL No results found for: HDL No results found for: LDLCALC No results found for: TRIG No results found for: CHOLHDL No results found for: LDLDIRECT    Radiology: Dg Chest 1 View  Result Date: 10/16/2016 CLINICAL DATA:  Encounter for intubation EXAM: CHEST 1 VIEW COMPARISON:  10/16/2016 at 1729 hours FINDINGS: Endotracheal tube tip is 3.9 cm above the carina. Gastric tube with end and side ports are noted in the expected location of the stomach. The heart is enlarged. Low lung volumes with crowding of interstitial lung markings and bibasilar atelectasis. Probable small left greater than right pleural effusions blunting the costophrenic angles. No overt pulmonary edema. Tortuous ectatic appearance of the thoracic aorta with atherosclerosis. ACDF of the included lower cervical spine. IMPRESSION: 1. Satisfactory support line and tube positions. 2. Low lung volumes with atelectasis. Small left greater than right pleural effusions. 3. Aortic atherosclerosis. Electronically Signed   By: Tollie Ethavid  Kwon M.D.   On: 10/16/2016 22:19   Dg Abd 1 View  Result Date: 10/16/2016 CLINICAL DATA:  Nasogastric tube placement, initial encounter EXAM: ABDOMEN - 1 VIEW COMPARISON:  CT abdomen and pelvis 12/28/2014 FINDINGS: The tip and side port of a gastric tube are noted in the body of the stomach. Bowel gas pattern is unremarkable apart from increased colonic stool burden along the ascending and proximal transverse colon and to a lesser degree the proximal descending colon. Dextroconvex curvature of the lumbar spine with degenerative disc disease and osteophytes consistent with  spondylosis. No radio-opaque calculi. No free air. IMPRESSION: Satisfactory gastric tube tip position within the body of the stomach. Electronically Signed   By: Tollie Ethavid  Kwon M.D.   On: 10/16/2016 22:21   Dg Chest Port 1 View  Result Date: 10/16/2016 CLINICAL DATA:  Increasing dyspnea and bilateral lower extremity swelling over the past week. Chest pain. EXAM: PORTABLE CHEST 1 VIEW COMPARISON:  10/09/2015 FINDINGS: Crowding of interstitial lung markings due to low lung volumes, similar to that seen previously. Heart is borderline enlarged. No aortic aneurysm. No overt pulmonary edema. Patient's chin obscures apices. Chronic elevation of right hemidiaphragm. No acute nor suspicious osseous abnormality. IMPRESSION: Low lung volumes with bibasilar right greater than left atelectasis, similar to previous study. No active cardiopulmonary disease. Electronically Signed   By: Rene Kocheravid  Kwon M.D.  On: 10/16/2016 17:48    EKG: Normal sinus rhythm, no acute ischemic ST-T wave changes  ASSESSMENT AND PLAN:   1. Borderline elevated troponin, likely due to demand supply ischemia, not due to acute coronary syndrome 2. Respiratory failure, multifactorial, secondary to COPD, acute on chronic diastolic CHF, and obstructive sleep apnea  Recommendations  1. Agree with current therapy 2. Defer full dose anticoagulation 3. Review 2-D echocardiogram  Signed off for now, please call if any questions    Signed: Marcina Millard MD,PhD, [facility] 10/17/2016, 10:53 AM

## 2016-10-18 ENCOUNTER — Inpatient Hospital Stay: Payer: Self-pay

## 2016-10-18 LAB — CBC
HCT: 43.1 % (ref 40.0–52.0)
Hemoglobin: 14.1 g/dL (ref 13.0–18.0)
MCH: 31.1 pg (ref 26.0–34.0)
MCHC: 32.6 g/dL (ref 32.0–36.0)
MCV: 95.1 fL (ref 80.0–100.0)
PLATELETS: 306 10*3/uL (ref 150–440)
RBC: 4.53 MIL/uL (ref 4.40–5.90)
RDW: 14.3 % (ref 11.5–14.5)
WBC: 18.2 10*3/uL — ABNORMAL HIGH (ref 3.8–10.6)

## 2016-10-18 LAB — PHOSPHORUS
Phosphorus: 3.3 mg/dL (ref 2.5–4.6)
Phosphorus: 5 mg/dL — ABNORMAL HIGH (ref 2.5–4.6)

## 2016-10-18 LAB — BASIC METABOLIC PANEL
Anion gap: 11 (ref 5–15)
BUN: 20 mg/dL (ref 6–20)
CO2: 38 mmol/L — ABNORMAL HIGH (ref 22–32)
Calcium: 8.4 mg/dL — ABNORMAL LOW (ref 8.9–10.3)
Chloride: 91 mmol/L — ABNORMAL LOW (ref 101–111)
Creatinine, Ser: 0.94 mg/dL (ref 0.61–1.24)
GFR calc Af Amer: >60 mL/min (ref >60)
GLUCOSE: 173 mg/dL — AB (ref 65–99)
POTASSIUM: 3.9 mmol/L (ref 3.5–5.1)
Sodium: 140 mmol/L (ref 135–145)

## 2016-10-18 LAB — HIV ANTIBODY (ROUTINE TESTING W REFLEX): HIV SCREEN 4TH GENERATION: NONREACTIVE

## 2016-10-18 LAB — ECHOCARDIOGRAM COMPLETE
CHL CUP MV DEC (S): 292
E decel time: 292 msec
FS: 53 % — AB (ref 28–44)
HEIGHTINCHES: 72 in
IVS/LV PW RATIO, ED: 1.24
LA ID, A-P, ES: 57 mm
LA diam index: 2.25 cm/m2
LEFT ATRIUM END SYS DIAM: 57 mm
LV PW d: 10.4 mm — AB (ref 0.6–1.1)
MV Peak grad: 3 mmHg
MV pk A vel: 101 m/s
MV pk E vel: 80 m/s
MVAP: 2.56 cm2
P 1/2 time: 86 ms
RV LATERAL S' VELOCITY: 13.5 cm/s
TAPSE: 28.7 mm
WEIGHTICAEL: 4317.49 [oz_av]

## 2016-10-18 LAB — PROCALCITONIN

## 2016-10-18 LAB — POTASSIUM: POTASSIUM: 3.4 mmol/L — AB (ref 3.5–5.1)

## 2016-10-18 LAB — GLUCOSE, CAPILLARY
GLUCOSE-CAPILLARY: 157 mg/dL — AB (ref 65–99)
Glucose-Capillary: 144 mg/dL — ABNORMAL HIGH (ref 65–99)
Glucose-Capillary: 137 mg/dL — ABNORMAL HIGH (ref 65–99)

## 2016-10-18 LAB — MAGNESIUM
MAGNESIUM: 1.9 mg/dL (ref 1.7–2.4)
Magnesium: 2.1 mg/dL (ref 1.7–2.4)

## 2016-10-18 MED ORDER — POTASSIUM CHLORIDE 20 MEQ PO PACK
40.0000 meq | PACK | Freq: Once | ORAL | Status: AC
  Administered 2016-10-18: 40 meq via ORAL
  Filled 2016-10-18: qty 2

## 2016-10-18 MED ORDER — PANTOPRAZOLE SODIUM 40 MG PO TBEC
40.0000 mg | DELAYED_RELEASE_TABLET | Freq: Two times a day (BID) | ORAL | Status: DC
  Administered 2016-10-18 (×2): 40 mg via ORAL
  Filled 2016-10-18 (×2): qty 1

## 2016-10-18 MED ORDER — INSULIN ASPART 100 UNIT/ML ~~LOC~~ SOLN
0.0000 [IU] | Freq: Three times a day (TID) | SUBCUTANEOUS | Status: DC
  Administered 2016-10-18: 2 [IU] via SUBCUTANEOUS
  Administered 2016-10-18 – 2016-10-19 (×3): 3 [IU] via SUBCUTANEOUS
  Administered 2016-10-19: 5 [IU] via SUBCUTANEOUS
  Administered 2016-10-20 (×2): 3 [IU] via SUBCUTANEOUS
  Administered 2016-10-20: 2 [IU] via SUBCUTANEOUS
  Administered 2016-10-21 (×2): 3 [IU] via SUBCUTANEOUS
  Administered 2016-10-21: 5 [IU] via SUBCUTANEOUS
  Administered 2016-10-22: 3 [IU] via SUBCUTANEOUS
  Administered 2016-10-22: 5 [IU] via SUBCUTANEOUS
  Filled 2016-10-18 (×13): qty 1

## 2016-10-18 NOTE — Progress Notes (Signed)
Pt placed on PSV in preparation for extubation. Fentanyl titrated down then cut off. Pt alert and following commands. Pt extubated placed on 4L [place], tolerating well. Will continue to monitor.

## 2016-10-18 NOTE — Progress Notes (Signed)
Name: Aaron Doyle MRN: [id number] DOB: 1955/12/14    ADMISSION DATE:  10/16/2016   CHIEF COMPLAINT:  Shortness Of breath  BRIEF PATIENT DESCRIPTION: 61 yo male with COPD,CHF,morbid obesity came with shortness of breath. Was initially placed on BiPAP,Failed BiPAP therefore was intubated.  SIGNIFICANT EVENTS  7/21 Patient came with SOB and lethargy, Placed on BiPAP 7/21 Failed BiPAP 7/21 Intubated 7/23 Extubated   STUDIES:  03/10/15 ECHO>>NORMAL LEFT VENTRICULAR SYSTOLIC FUNCTION WITH MILD LVH MODERATE VALVULAR REGURGITATION (See above) NO VALVULAR STENOSIS MODERATE PHTN EF >55%   HISTORY OF PRESENT ILLNESS:  Aaron Doyle is a 61 year old male with known history of CHF,COPD  Uses 2l of O2,OSA,HTN  And morbid obesity. Patient presents to ED on 7/21 with worsening shortness of breath over 3 days.  Patient was initially placed on BiPAP and his initial gas was 7.34/91/58/49.1( venous), an hour later his CO2 appeared to be worse with 7.33/95/76/50.1.  Patient was sent to the ICU on BiPAP.  Upon arrival to the unit, patient was very somnolent and barely responding to even painful stimuli therefore decided to intubate for respiratory insufficiency.   SUBJECTIVE: Extubated this morning. Doing well on 4L [place].   VITAL SIGNS: Temp:  [98.2 F (36.8 C)-98.8 F (37.1 C)] 98.2 F (36.8 C) (07/23 0000) Pulse Rate:  [55-72] 59 (07/22 2300) Resp:  [13-19] 16 (07/22 1900) BP: (98-125)/(60-83) 98/81 (07/22 2300) SpO2:  [88 %-94 %] 90 % (07/22 2300) FiO2 (%):  [40 %-50 %] 40 % (07/23 0442) Weight:  [263 lb 10.7 oz (119.6 kg)] 263 lb 10.7 oz (119.6 kg) (07/23 0500)  PHYSICAL EXAMINATION: General: Morbidly obese, Caucasian male, in severe distress Neuro: AAO X2, speech is normal, follows commands, moves all extremities HEENT: PERRLA, oral mucosa with moderate secretions Cardiovascular: Regular S1S2, no m/r/g, +2 pulses,+2 edema Lungs:  Bilateral breath sounds, diminished in the bases, no  wheezing Abdomen: obese,distended,round Musculoskeletal:  No deformities, no joint swelling Skin: Scabs present all over the body   Recent Labs Lab 10/16/16 1720 10/17/16 0517 10/18/16 0029  NA 138 138  --   K 3.0* 3.2* 3.4*  CL 89* 89*  --   CO2 36* 37*  --   BUN 13 14  --   CREATININE 0.82 0.93  --   GLUCOSE 141* 191*  --     Recent Labs Lab 10/16/16 1720 10/17/16 0517  HGB 13.7 13.1  HCT 40.8 38.5*  WBC 11.8* 9.6  PLT 306 248   Dg Chest 1 View  Result Date: 10/16/2016 CLINICAL DATA:  Encounter for intubation EXAM: CHEST 1 VIEW COMPARISON:  10/16/2016 at 1729 hours FINDINGS: Endotracheal tube tip is 3.9 cm above the carina. Gastric tube with end and side ports are noted in the expected location of the stomach. The heart is enlarged. Low lung volumes with crowding of interstitial lung markings and bibasilar atelectasis. Probable small left greater than right pleural effusions blunting the costophrenic angles. No overt pulmonary edema. Tortuous ectatic appearance of the thoracic aorta with atherosclerosis. ACDF of the included lower cervical spine. IMPRESSION: 1. Satisfactory support line and tube positions. 2. Low lung volumes with atelectasis. Small left greater than right pleural effusions. 3. Aortic atherosclerosis. Electronically Signed   By: Tollie Eth M.D.   On: 10/16/2016 22:19   Dg Abd 1 View  Result Date: 10/16/2016 CLINICAL DATA:  Nasogastric tube placement, initial encounter EXAM: ABDOMEN - 1 VIEW COMPARISON:  CT abdomen and pelvis 12/28/2014 FINDINGS: The tip and side  port of a gastric tube are noted in the body of the stomach. Bowel gas pattern is unremarkable apart from increased colonic stool burden along the ascending and proximal transverse colon and to a lesser degree the proximal descending colon. Dextroconvex curvature of the lumbar spine with degenerative disc disease and osteophytes consistent with spondylosis. No radio-opaque calculi. No free air.  IMPRESSION: Satisfactory gastric tube tip position within the body of the stomach. Electronically Signed   By: Tollie Ethavid  Kwon M.D.   On: 10/16/2016 22:21   Dg Chest Port 1 View  Result Date: 10/16/2016 CLINICAL DATA:  Increasing dyspnea and bilateral lower extremity swelling over the past week. Chest pain. EXAM: PORTABLE CHEST 1 VIEW COMPARISON:  10/09/2015 FINDINGS: Crowding of interstitial lung markings due to low lung volumes, similar to that seen previously. Heart is borderline enlarged. No aortic aneurysm. No overt pulmonary edema. Patient's chin obscures apices. Chronic elevation of right hemidiaphragm. No acute nor suspicious osseous abnormality. IMPRESSION: Low lung volumes with bibasilar right greater than left atelectasis, similar to previous study. No active cardiopulmonary disease. Electronically Signed   By: Tollie Ethavid  Kwon M.D.   On: 10/16/2016 17:48    ASSESSMENT / PLAN:    Acute on chronic hypoxic/hypercarbic  Respiratory failure secondary to COPD exacerbation Obesity Hypoventilation Syndrome Obstructive Sleep Apnea Diastolic Heart Failure Bilateral lower extremity edema Hypertension Hyperlipidemia Tobacco Abuse   Plan Extubated; doing well Continue steroids, taper F/U ECHO results Continue diuretics Continue Zosyn; levaquin discontinued 7/21 Monitor fever,cbc F/U cardiology consult Continue Aspirin Continue Lipitor Continue nebulized Bronchodilators Monitor and correct electrolyte imbalances Follow blood culture Smoking cessation advice given  Magdalene S. [facility] ANP-BC Pulmonary and Critical Care Medicine [facility] Pager [phone] or [phone]  10/18/2016, 5:34 AM   STAFF NOTE: I. Dr. Nicholos Johnsamachandran, have personally reviewed the patient's available data including medical history , events of notes, physican examination and test results as part of my evaluation. I have discussed with the  Care with the NP and other care providers including  pharmacist, ICU  RN, RRT, dietary.  Physical Exam  Lungs - Decreased air entry bilaterally.   Patient is lethargic but arousable. Patient tells me he was evaluated for obstructive sleep apnea with a sleep study, and was told that he needed to be started on CPAP, however, during this process, he lost his insurance, and has not yet started on CPAP. We'll start the patient on BiPAP daily at bedtime and when necessary, will monitor today throughout the day in the intensive care unit. Will need follow-up for initiation of CPAP outpatient.   Wells Guileseep Aceson Labell, MD.   Board Certified in Internal Medicine, Pulmonary Medicine, Critical Care Medicine, and Sleep Medicine.  [facility] Office Number: [phone] Pager: [phone]  Santiago Gladavid Kasa, M.D.  Billy Fischeravid Simonds, M.D

## 2016-10-18 NOTE — Progress Notes (Signed)
OT Cancellation Note  Patient Details Name: Aaron Doyle MRN: [id number] DOB: May 15, 1955   Cancelled Treatment:    Reason Eval/Treat Not Completed: Patient not medically ready. Order received, chart reviewed. Per notes, patient just extubated this date; currently on 4L supplemental O2.  Will hold therapy until next date to ensure respiratory stability prior to initiation of exertional activity.  Will re-attempt OT evaluation next date.  Richrd PrimeJamie Stiller, MPH, MS, OTR/L ascom [phone] 10/18/16, 4:01 PM

## 2016-10-18 NOTE — Progress Notes (Signed)
Patient was placed on PSV 10 peep 5 then PSV 5 peep 5 in preparation for extubation.  Patient alert and following commands well. Airway suctioned. Et tube pulled. Patient tolerated procedure well. o2 applied via nasal cannula.

## 2016-10-18 NOTE — Consult Note (Signed)
Pharmacy Antibiotic Note  Aaron Doyle is a 61 y.o. male admitted on 10/16/2016 with pneumonia.  Pharmacy has been consulted for Zosyn dosing.  Plan: Zosyn 3.375g IV q8h (4 hour infusion).  Height: 6' (182.9 cm) Weight: 263 lb 10.7 oz (119.6 kg) IBW/kg (Calculated) : 77.6  Temp (24hrs), Avg:98.1 F (36.7 C), Min:97.7 F (36.5 C), Max:98.3 F (36.8 C)   Recent Labs Lab 10/16/16 1720 10/17/16 0517 10/18/16 0552  WBC 11.8* 9.6 18.2*  CREATININE 0.82 0.93 0.94    Estimated Creatinine Clearance: 108.8 mL/min (by C-G formula based on SCr of 0.94 mg/dL).    No Known Allergies  Antimicrobials this admission: Levofloxacin  7/21 >> one dose Zosyn 7/21 >>   Dose adjustments this admission: N/A   Microbiology results: 7/22 Trach Aspirate: Gram Positive Rods 7/22 BCx: no grwoth x 1 day  7/22 MRSA PCR: negative    Thank you for allowing pharmacy to be a part of this patient's care.  MLS 10/18/2016 4:43 PM

## 2016-10-18 NOTE — Progress Notes (Signed)
PT Cancellation Note  Patient Details Name: Aaron Doyle MRN: [id number] DOB: 09-Jan-1956   Cancelled Treatment:    Reason Eval/Treat Not Completed:  (Consult received and chart reviewed. Per notes, patient just extubated this date; currently on 4L supplemental O2.  Will hold therapy until next date to ensure respiratory stability prior to initiation of exertional activity.  Will re-attempt next date.)   [name] H. Manson PasseyBrown, PT, DPT, NCS 10/18/16, 2:12 PM [phone]

## 2016-10-18 NOTE — Progress Notes (Signed)
Good day. Tolerated extubation and weaned to 3 liters nasal cannula. Progressed from clear liquids to carb.modified diet . 6 pm Lasix dose netted 1000 liter of urine in 38 minutes.MAE without issues. PT and OT consults requested. Remains in 1st degree AV block rate in the 60s..Marland Kitchen

## 2016-10-18 NOTE — Progress Notes (Signed)
[facility] [place] [facility]   PATIENT NAME: Aaron NorlanderClyde Doyle    MR#:  [id number]  DATE OF BIRTH:  [birth year]  SUBJECTIVE:  CHIEF COMPLAINT:   Chief Complaint  Patient presents with  . Shortness of Breath  . Leg Swelling   Came with respiratory failure and lethargy, was on BiPAP on admission. Overnight due to worsening intubated and vent support. Extubated 10/18/16. No complains.  REVIEW OF SYSTEMS:    Review of Systems  Constitutional: Negative for chills and weight loss.  HENT: Negative for congestion, ear discharge and ear pain.   Eyes: Negative for blurred vision and double vision.  Respiratory: Positive for cough, shortness of breath and wheezing.   Cardiovascular: Negative for chest pain, palpitations and leg swelling.  Gastrointestinal: Negative for abdominal pain, diarrhea, nausea and vomiting.  Genitourinary: Negative for dysuria, frequency and hematuria.  Musculoskeletal: Negative for back pain and myalgias.  Neurological: Negative for dizziness, tremors and headaches.  Psychiatric/Behavioral: Negative for depression and substance abuse.    DRUG ALLERGIES:  No Known Allergies  VITALS:  Blood pressure 118/71, pulse 60, temperature 98.7 F (37.1 C), resp. rate 15, height 6' (1.829 m), weight 119.6 kg (263 lb 10.7 oz), SpO2 96 %.  PHYSICAL EXAMINATION:  GENERAL:  61 y.o.-year-old patient lying in the bed with no acute distress.  EYES: Pupils equal, round, reactive to light and accommodation. No scleral icterus. Extraocular muscles intact.  HEENT: Head atraumatic, normocephalic. Oropharynx and nasopharynx clear.  NECK:  Supple, no jugular venous distention. No thyroid enlargement, no tenderness.  LUNGS: Normal breath sounds bilaterally, positive for wheezing wheezing, no crepitation. No use of accessory muscles of respiration.  CARDIOVASCULAR: S1, S2 normal. No murmurs, rubs, or gallops.  ABDOMEN: Soft, nontender, nondistended. Bowel sounds  present. No organomegaly or mass.  EXTREMITIES: No pedal edema, cyanosis, or clubbing.  NEUROLOGIC: Patient is alert, moves all 4 limbs, follows commands power 5/5. PSYCHIATRIC: The patient is alert and oriented.  SKIN: No obvious rash, lesion, or ulcer.   Physical Exam LABORATORY PANEL:   CBC  Recent Labs Lab 10/18/16 0552  WBC 18.2*  HGB 14.1  HCT 43.1  PLT 306   ------------------------------------------------------------------------------------------------------------------  Chemistries   Recent Labs Lab 10/17/16 0517  10/18/16 0552  NA 138  --  140  K 3.2*  < > 3.9  CL 89*  --  91*  CO2 37*  --  38*  GLUCOSE 191*  --  173*  BUN 14  --  20  CREATININE 0.93  --  0.94  CALCIUM 8.0*  --  8.4*  MG  --   < > 2.1  AST 24  --   --   ALT 19  --   --   ALKPHOS 82  --   --   BILITOT 0.7  --   --   < > = values in this interval not displayed. ------------------------------------------------------------------------------------------------------------------  Cardiac Enzymes  Recent Labs Lab 10/17/16 0517 10/17/16 1111  TROPONINI 0.03* <0.03   ------------------------------------------------------------------------------------------------------------------  RADIOLOGY:  Dg Chest 1 View  Result Date: 10/16/2016 CLINICAL DATA:  Encounter for intubation EXAM: CHEST 1 VIEW COMPARISON:  10/16/2016 at 1729 hours FINDINGS: Endotracheal tube tip is 3.9 cm above the carina. Gastric tube with end and side ports are noted in the expected location of the stomach. The heart is enlarged. Low lung volumes with crowding of interstitial lung markings and bibasilar atelectasis. Probable small left greater than right pleural effusions blunting the costophrenic angles. No  overt pulmonary edema. Tortuous ectatic appearance of the thoracic aorta with atherosclerosis. ACDF of the included lower cervical spine. IMPRESSION: 1. Satisfactory support line and tube positions. 2. Low lung volumes with  atelectasis. Small left greater than right pleural effusions. 3. Aortic atherosclerosis. Electronically Signed   By: Tollie Eth M.D.   On: 10/16/2016 22:19   Dg Abd 1 View  Result Date: 10/16/2016 CLINICAL DATA:  Nasogastric tube placement, initial encounter EXAM: ABDOMEN - 1 VIEW COMPARISON:  CT abdomen and pelvis 12/28/2014 FINDINGS: The tip and side port of a gastric tube are noted in the body of the stomach. Bowel gas pattern is unremarkable apart from increased colonic stool burden along the ascending and proximal transverse colon and to a lesser degree the proximal descending colon. Dextroconvex curvature of the lumbar spine with degenerative disc disease and osteophytes consistent with spondylosis. No radio-opaque calculi. No free air. IMPRESSION: Satisfactory gastric tube tip position within the body of the stomach. Electronically Signed   By: Tollie Eth M.D.   On: 10/16/2016 22:21   Dg Chest Port 1 View  Result Date: 10/18/2016 CLINICAL DATA:  Respiratory failure. EXAM: PORTABLE CHEST 1 VIEW COMPARISON:  10/16/2016 . FINDINGS: Endotracheal tube, NG tube in stable position. Heart size stable. Low lung volumes with bibasilar atelectasis. Small left pleural effusion. No pneumothorax. IMPRESSION: 1. Lines and tubes in stable position. 2. Bibasilar subsegmental atelectasis and small left pleural effusion. No change from prior exam . Electronically Signed   By: Maisie Fus  Register   On: 10/18/2016 06:30    ASSESSMENT AND PLAN:   Principal Problem:   Acute respiratory failure with hypoxia and hypercapnia (HCC) Active Problems:   COPD (chronic obstructive pulmonary disease) (HCC)   Acute on chronic diastolic CHF (congestive heart failure), NYHA class 1 (HCC)   OSA (obstructive sleep apnea)   Acute respiratory failure (HCC)   Encounter for intubation  * Acute respiratory failure with hypoxia and hypercapnia   Acute COPD exacerbation   Acute on chronic diastolic CHF    Status post intubation,  manage per intensivist. Continue Solu-Medrol, bronchodilators, Zosyn.   IV Lasix. Echocardiogram ordered.  extubated now. Better.  * Hypertension    continue carvedilol.  * Hypokalemia   Replace per ICU protocol.  All the records are reviewed and case discussed with Doyle Management/Social Workerr. Management plans discussed with the patient, family and they are in agreement.  CODE STATUS: Full.  TOTAL TIME TAKING Doyle OF THIS PATIENT: 35 minutes.     POSSIBLE D/C IN 2-3 DAYS, DEPENDING ON CLINICAL CONDITION.   Altamese Dilling M.D on 10/18/2016   Between 7am to 6pm - Pager - [phone]  After 6pm go to [email or web address] - password [email or web address] [facility]  [facility]  Office  [phone]  CC: Primary Doyle physician; Abran Richard, PA-C  Note: This dictation was prepared with Dragon dictation along with smaller phrase technology. Any transcriptional errors that result from this process are unintentional.

## 2016-10-19 LAB — GLUCOSE, CAPILLARY
GLUCOSE-CAPILLARY: 236 mg/dL — AB (ref 65–99)
GLUCOSE-CAPILLARY: 178 mg/dL — AB (ref 65–99)
Glucose-Capillary: 179 mg/dL — ABNORMAL HIGH (ref 65–99)

## 2016-10-19 LAB — CBC
HEMATOCRIT: 40.8 % (ref 40.0–52.0)
HEMOGLOBIN: 13.3 g/dL (ref 13.0–18.0)
MCH: 31.1 pg (ref 26.0–34.0)
MCHC: 32.7 g/dL (ref 32.0–36.0)
MCV: 95.2 fL (ref 80.0–100.0)
Platelets: 250 10*3/uL (ref 150–440)
RBC: 4.28 MIL/uL — AB (ref 4.40–5.90)
RDW: 14.3 % (ref 11.5–14.5)
WBC: 13.5 10*3/uL — ABNORMAL HIGH (ref 3.8–10.6)

## 2016-10-19 LAB — CULTURE, RESPIRATORY

## 2016-10-19 LAB — BASIC METABOLIC PANEL
Anion gap: 11 (ref 5–15)
BUN: 23 mg/dL — AB (ref 6–20)
CHLORIDE: 89 mmol/L — AB (ref 101–111)
CO2: 41 mmol/L — AB (ref 22–32)
Calcium: 8.3 mg/dL — ABNORMAL LOW (ref 8.9–10.3)
Creatinine, Ser: 0.91 mg/dL (ref 0.61–1.24)
GFR calc non Af Amer: >60 mL/min (ref >60)
Glucose, Bld: 163 mg/dL — ABNORMAL HIGH (ref 65–99)
POTASSIUM: 4.2 mmol/L (ref 3.5–5.1)
SODIUM: 141 mmol/L (ref 135–145)

## 2016-10-19 LAB — CULTURE, RESPIRATORY W GRAM STAIN: Culture: NORMAL

## 2016-10-19 LAB — MAGNESIUM: MAGNESIUM: 2.3 mg/dL (ref 1.7–2.4)

## 2016-10-19 LAB — PROCALCITONIN

## 2016-10-19 MED ORDER — ALLOPURINOL 300 MG PO TABS
300.0000 mg | ORAL_TABLET | Freq: Every day | ORAL | Status: DC
  Administered 2016-10-19 – 2016-10-21 (×3): 300 mg via ORAL
  Filled 2016-10-19 (×4): qty 1

## 2016-10-19 MED ORDER — CARVEDILOL 6.25 MG PO TABS
6.2500 mg | ORAL_TABLET | Freq: Two times a day (BID) | ORAL | Status: DC
  Administered 2016-10-19 – 2016-10-21 (×4): 6.25 mg via ORAL
  Filled 2016-10-19 (×4): qty 1

## 2016-10-19 MED ORDER — ACETAZOLAMIDE 250 MG PO TABS
250.0000 mg | ORAL_TABLET | Freq: Three times a day (TID) | ORAL | Status: DC
  Administered 2016-10-19 – 2016-10-22 (×9): 250 mg via ORAL
  Filled 2016-10-19 (×13): qty 1

## 2016-10-19 MED ORDER — METHYLPREDNISOLONE SODIUM SUCC 40 MG IJ SOLR
40.0000 mg | Freq: Three times a day (TID) | INTRAMUSCULAR | Status: DC
  Administered 2016-10-19 – 2016-10-22 (×9): 40 mg via INTRAVENOUS
  Filled 2016-10-19 (×11): qty 1

## 2016-10-19 MED ORDER — ATORVASTATIN CALCIUM 20 MG PO TABS
40.0000 mg | ORAL_TABLET | Freq: Every day | ORAL | Status: DC
  Administered 2016-10-19 – 2016-10-21 (×3): 40 mg via ORAL
  Filled 2016-10-19 (×3): qty 2

## 2016-10-19 MED ORDER — ACETAMINOPHEN 325 MG PO TABS: 650.0000 mg | ORAL_TABLET | Freq: Four times a day (QID) | ORAL | Status: DC | PRN

## 2016-10-19 MED ORDER — DOCUSATE SODIUM 100 MG PO CAPS
100.0000 mg | ORAL_CAPSULE | Freq: Two times a day (BID) | ORAL | Status: DC
  Administered 2016-10-19 – 2016-10-22 (×6): 100 mg via ORAL
  Filled 2016-10-19 (×7): qty 1

## 2016-10-19 MED ORDER — ACETAMINOPHEN 650 MG RE SUPP: 650.0000 mg | Freq: Four times a day (QID) | RECTAL | Status: DC | PRN

## 2016-10-19 MED ORDER — FUROSEMIDE 10 MG/ML IJ SOLN
40.0000 mg | Freq: Every day | INTRAMUSCULAR | Status: DC
  Administered 2016-10-20 – 2016-10-22 (×3): 40 mg via INTRAVENOUS
  Filled 2016-10-19 (×3): qty 4

## 2016-10-19 NOTE — Progress Notes (Signed)
Chaplain visited with patient, while on rounds. Provided ministry of presence, words of encouragement, and offered silent prayer.    10/19/16 1600  Clinical Encounter Type  Visited With Patient  Visit Type Follow-up;Spiritual support  Referral From Chaplain  Consult/Referral To Chaplain  Spiritual Encounters  Spiritual Needs Other (Comment)

## 2016-10-19 NOTE — Evaluation (Addendum)
Physical Therapy Evaluation Patient Details Name: Aaron CruzClyde K Doyle MRN: [id number] DOB: [birth year] Today's Date: 10/19/2016   History of Present Illness  presented to ER secondary to 3-day history of SOB, DOE; admitted with acute respiratory failure related to CHF, COPD exacerbation requiring intubation 7/21-7/23; now weaned to 4L supplemental O2 via [place]  Clinical Impression  Upon evaluation, patient alert and oriented; follows all commands and demonstrates fair insight/safety awareness.  Eager for OOB mobility as tolerated.  Bilat UE/LE strength and ROM grossly WFL for basic transfers and mobility (R UE elevation with limited ROM-baseline for patient).  Currently requiring min assist for sit/stand and short-distance gait (20') with RW.  Antalgic with decreased stance time, decreased stability to R LE (high risk for buckling).  Desat to 86% with minimal gait distance, requiring seated rest period for recovery to >90% on 4L [place].  Lacks cardiopulmonary endurance/ability to safely/independently negotiate home environment. Would benefit from skilled PT to address above deficits and promote optimal return to PLOF; recommend transition to STR upon discharge from acute hospitalization.  Will continue to monitor progress as health status improves.     Follow Up Recommendations SNF    Equipment Recommendations  Rolling walker with 5" wheels    Recommendations for Other Services       Precautions / Restrictions Precautions Precautions: Fall Restrictions Weight Bearing Restrictions: No      Mobility  Bed Mobility Overal bed mobility: Needs Assistance Bed Mobility: Supine to Sit     Supine to sit: Supervision;HOB elevated     General bed mobility comments: seated edge of bed upon arrival to session; in recliner end of session  Transfers Overall transfer level: Needs assistance Equipment used: Rolling walker (2 wheeled) Transfers: Sit to/from Stand Sit to Stand: Min assist          General transfer comment: broad BOS, decreased LE strenth/power, requiring use of momentum to initiate lift off  Ambulation/Gait Ambulation/Gait assistance: Min assist Ambulation Distance (Feet): 20 Feet Assistive device: Rolling walker (2 wheeled)       General Gait Details: broad BOS with flat foot contact, decreased stance time R LE with mild R knee flexion throughout gait cycle.  Additional gait limited by R knee pain, SOB (desat to 86% with minimal activity, requiring seated rest period for reovery >90%)   BORG 7/10 after minimal gait distances.  Stairs            Wheelchair Mobility    Modified Rankin (Stroke Patients Only)       Balance Overall balance assessment: Needs assistance Sitting-balance support: No upper extremity supported;Feet supported Sitting balance-Leahy Scale: Good     Standing balance support: Bilateral upper extremity supported Standing balance-Leahy Scale: Poor                               Pertinent Vitals/Pain Pain Assessment: Faces Pain Score: 8  Faces Pain Scale: Hurts even more Pain Location: R  knee Pain Descriptors / Indicators: Aching;Grimacing Pain Intervention(s): Monitored during session;Repositioned;Limited activity within patient's tolerance    Home Living Family/patient expects to be discharged to:: Private residence Living Arrangements: Children (son-not available to provide 24/7 sup/assist) Available Help at Discharge: Family;Available PRN/intermittently Type of Home: House Home Access: Stairs to enter Entrance Stairs-Rails: Doctor, general practiceight;Left Entrance Stairs-Number of Steps: 4 Home Layout: One level Home Equipment: Walker - 2 wheels;Cane - single point;Grab bars - toilet;Grab bars - tub/shower      Prior Function  Level of Independence: Independent with assistive device(s)         Comments: Pt indep with ADL, IADL including driving, was using a SPC PRN for community mobility due to R knee pain (pt states he  needs that knee replaced, L knee replaced previously). 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 Endorses "a couple" falls in past 12 months, most recently fell 2 wks ago in kitchen 2:2 RLE "gave out"     Hand Dominance        Extremity/Trunk Assessment   Upper Extremity Assessment Upper Extremity Assessment:  (R shoulder limited approx 80-90 degrees elevation (prior cervical surgery), otherwise, bilat UEs grossly WFL, 4/5)    Lower Extremity Assessment Lower Extremity Assessment: Generalized weakness (grossly at least 4-/5 throughout)    Cervical / Trunk Assessment Cervical / Trunk Assessment: Normal  Communication   Communication: No difficulties  Cognition Arousal/Alertness: Awake/alert Behavior During Therapy: WFL for tasks assessed/performed Overall Cognitive Status: Within Functional Limits for tasks assessed                                        General Comments      Exercises Other Exercises Other Exercises: Pt educated in pursed lip breathing technique to support fatigue mgt and SOB; pt able to return demo good technique with objective improvement in O2 sats and heart rate, with pt reporting subjective improvement in SOB; pt stating "This will be really important to do throughout my day." Other Exercises: Pt educated in energy conservation strategies with a handout, including body/environmental positioning, routines modifications, falls prevention, activity pacing, and AE/DME to support O2 sats, fatigue mgt, and falls prevention. Pt verbalized understanding, thankful for the information.   Assessment/Plan    PT Assessment Patient needs continued PT services  PT Problem List Decreased strength;Decreased activity tolerance;Decreased balance;Decreased mobility;Decreased coordination;Decreased knowledge of use of DME;Decreased safety awareness;Decreased knowledge of precautions;Cardiopulmonary status limiting activity;Obesity        PT Treatment Interventions DME instruction;Gait training;Stair training;Functional mobility training;Therapeutic activities;Therapeutic exercise;Balance training;Patient/family education    PT Goals (Current goals can be found in the Care Plan section)  Acute Rehab PT Goals Patient Stated Goal: feel better so I can go home PT Goal Formulation: With patient Time For Goal Achievement: 11/02/16 Potential to Achieve Goals: Good    Frequency Min 2X/week   Barriers to discharge Decreased caregiver support      Co-evaluation               AM-PAC PT "6 Clicks" Daily Activity  Outcome Measure Difficulty turning over in bed (including adjusting bedclothes, sheets and blankets)?: Total Difficulty moving from lying on back to sitting on the side of the bed? : Total Difficulty sitting down on and standing up from a chair with arms (e.g., wheelchair, bedside commode, etc,.)?: Total Help needed moving to and from a bed to chair (including a wheelchair)?: A Little Help needed walking in hospital room?: A Little Help needed climbing 3-5 steps with a railing? : A Lot 6 Click Score: 11    End of Session Equipment Utilized During Treatment: Gait belt;Oxygen Activity Tolerance: Patient tolerated treatment well Patient left: in chair;with call bell/phone within reach Nurse Communication: Mobility status PT Visit Diagnosis: Difficulty in walking, not elsewhere classified (R26.2);Muscle weakness (generalized) (M62.81)    Time: 1030-1050 PT Time Calculation (min) (ACUTE ONLY): 20 min   Charges:  PT Evaluation $PT Eval Moderate Complexity: 1 Procedure PT Treatments $Therapeutic Activity: 8-22 mins   PT G Codes:   PT G-Codes **NOT FOR INPATIENT CLASS** Functional Assessment Tool Used: AM-PAC 6 Clicks Basic Mobility Functional Limitation: Mobility: Walking and moving around Mobility: Walking and Moving Around Current Status (Z6109): At least 40 percent but less than 60 percent impaired,  limited or restricted Mobility: Walking and Moving Around Goal Status [phone]): At least 1 percent but less than 20 percent impaired, limited or restricted    Laina Guerrieri H. Manson Passey, PT, DPT, NCS 10/19/16, 11:51 AM [phone]

## 2016-10-19 NOTE — Evaluation (Signed)
Occupational Therapy Evaluation Patient Details Name: Aaron Doyle MRN: [id number] DOB: 1955-11-11 Today's Date: 10/19/2016    History of Present Illness 61 yo male pt with COPD, CHF, morbid obesity came to ED on 7/21 with shortness of breath. Was initially placed on BiPAP, Failed BiPAP therefore was intubated 7/21-7/23.    Clinical Impression   Pt seen for OT evaluation this date. Pt was independent with ADL and IADL including O2 home mgt (4-5L), driving, cooking/cleaning at baseline and occasionally using SPC for community mobility. Pt has been retired for approx. 1 year and lives in 1 story home with his son (son works during the day), with 4 steps to enter and can reach both hand rails. Pt enjoys fishing and hunting, however has not been able to participate in these activities due to SOB/fatigue. Pt presents with impaired activity tolerance (fatigues with donning socks), 8/10 pain in R knee (reports he needs to get his knee replaced, and is the reason for at least 1 of his recent falls - "R leg gave out on me"), decreased knowledge of AE/DME for self care, and increased risk of future falls. Pt endorses "a couple" falls in past 12 months. Pt will benefit from skilled OT services to address noted impairments and resulting functional deficits in self care tasks, including activity pacing, energy conservation, work simplification, and falls prevention in order to maximize return to PLOF and minimize risk of future falls/injury/rehospitalization.     Follow Up Recommendations  Home health OT    Equipment Recommendations  3 in 1 bedside commode;Toilet riser;Other (comment);Tub/shower seat (HH shower head, LH sponge, sock aid, reacher, personal pulse oximeter)    Recommendations for Other Services       Precautions / Restrictions Precautions Precautions: Fall Restrictions Weight Bearing Restrictions: No      Mobility Bed Mobility Overal bed mobility: Needs Assistance Bed Mobility:  Supine to Sit     Supine to sit: Supervision;HOB elevated     General bed mobility comments: pt able to come to sitting EOB with supervision and VC to pace himself and encouraged deep breathing afterwards to support O2 sats  Transfers                      Balance Overall balance assessment: History of Falls;Needs assistance Sitting-balance support: Feet supported;Single extremity supported Sitting balance-Leahy Scale: Good                                     ADL either performed or assessed with clinical judgement   ADL Overall ADL's : Needs assistance/impaired Eating/Feeding: Sitting;Set up   Grooming: Sitting;Set up   Upper Body Bathing: Set up;Supervision/ safety;Sitting   Lower Body Bathing: Sitting/lateral leans;Supervison/ safety;Min guard Lower Body Bathing Details (indicate cue type and reason): pt endorsed bathing is the most exhausting daily activity he performs. Pt educated in [facility] sponge, shower seat, and use of terry cloth bathrobe to improve safety/independence/minimize SOB and fatigue with showering, pt verbalized understanding Upper Body Dressing : Set up;Supervision/safety;Sitting Upper Body Dressing Details (indicate cue type and reason): pt educated in benefits and technique for dressing RUE first to minimize pain in R shoulder, pt verbalized understanding Lower Body Dressing: Min guard;Sitting/lateral leans;Supervision/safety Lower Body Dressing Details (indicate cue type and reason): pt able to don socks with set up while seated EOB, bending over to his feet causing him to hold his breath. Verbal cues  for breathing/pursed lip breathing to improve O2 sats. Pt educated in sock aid and body positioning to minimize SOB and improve independence and minimize fatigue, pt verbalized understanding stating "That would be really helpful"               General ADL Comments: pt generally at supervision-min guard level for LB ADL due to SOB,  tendency to fatigue quickly     Vision Baseline Vision/History: Wears glasses Wears Glasses: At all times Patient Visual Report: No change from baseline Vision Assessment?: No apparent visual deficits     Perception     Praxis      Pertinent Vitals/Pain Pain Assessment: 0-10 Pain Score: 8  Pain Location: R knee Pain Descriptors / Indicators: Aching Pain Intervention(s): Limited activity within patient's tolerance;Monitored during session     Hand Dominance     Extremity/Trunk Assessment Upper Extremity Assessment Upper Extremity Assessment: Overall WFL for tasks assessed (R shoulder pain (after spinal fusion) limiting RUE shoulder flexion to ~90 degrees, grossly 4+/5 bilaterally)   Lower Extremity Assessment Lower Extremity Assessment: Defer to PT evaluation;Generalized weakness   Cervical / Trunk Assessment Cervical / Trunk Assessment: Normal   Communication Communication Communication: No difficulties   Cognition Arousal/Alertness: Awake/alert Behavior During Therapy: WFL for tasks assessed/performed Overall Cognitive Status: Within Functional Limits for tasks assessed                                     General Comments       Exercises Other Exercises Other Exercises: Pt educated in pursed lip breathing technique to support fatigue mgt and SOB; pt able to return demo good technique with objective improvement in O2 sats and heart rate, with pt reporting subjective improvement in SOB; pt stating "This will be really important to do throughout my day." Other Exercises: Pt educated in energy conservation strategies with a handout, including body/environmental positioning, routines modifications, falls prevention, activity pacing, and AE/DME to support O2 sats, fatigue mgt, and falls prevention. Pt verbalized understanding, thankful for the information.   Shoulder Instructions      Home Living Family/patient expects to be discharged to:: Private  residence Living Arrangements: Children (son) Available Help at Discharge: Family;Available PRN/intermittently (son works during the day) Type of Home: House Home Access: Stairs to enter [employer] of Steps: 4 Entrance Stairs-Rails: Right;Left;Can reach both Home Layout: One level     Bathroom Shower/Tub: Tub/shower unit;Door   [employer] Toilet: Standard     Home Equipment: Environmental consultant - 2 wheels;Cane - single point;Grab bars - toilet;Grab bars - tub/shower          Prior Functioning/Environment Level of Independence: Independent with assistive device(s)        Comments: Pt indep with ADL, IADL including driving, was using a SPC PRN for community mobility due to R knee pain (pt states he needs that knee replaced, L knee replaced previously). 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 Endorses "a couple" falls in past 12 months, most recently fell 2 wks ago in kitchen 2:2 RLE "gave out"        OT Problem List: Pain;Cardiopulmonary status limiting activity;Decreased activity tolerance;Decreased knowledge of use of DME or AE      OT Treatment/Interventions: Self-care/ADL training;Therapeutic exercise;Therapeutic activities;Energy conservation;DME and/or AE instruction;Patient/family education    OT Goals(Current goals can be found in the care plan section) Acute Rehab OT Goals Patient Stated Goal:  feel better so I can go home OT Goal Formulation: With patient Time For Goal Achievement: 11/02/16 Potential to Achieve Goals: Good  OT Frequency: Min 1X/week   Barriers to D/C: Decreased caregiver support          Co-evaluation              AM-PAC PT "6 Clicks" Daily Activity     Outcome Measure Help from another person eating meals?: None Help from another person taking care of personal grooming?: None Help from another person toileting, which includes using toliet, bedpan, or urinal?: A Little Help from another person bathing (including  washing, rinsing, drying)?: A Little Help from another person to put on and taking off regular upper body clothing?: None Help from another person to put on and taking off regular lower body clothing?: A Little 6 Click Score: 21   End of Session    Activity Tolerance: Patient tolerated treatment well Patient left: in bed;with call bell/phone within reach;with nursing/sitter in room (seated EOB to eat breakfast and with RN in room to adjust BP cuff)  OT Visit Diagnosis: Repeated falls (R29.6);Other abnormalities of gait and mobility (R26.89);Pain Pain - Right/Left: Right Pain - part of body: Knee                Time: 0904-1001 OT Time Calculation (min): 57 min Charges:  OT General Charges $OT Visit: 1 Procedure OT Evaluation $OT Eval Low Complexity: 1 Procedure OT Treatments $Self Care/Home Management : 23-37 mins $Therapeutic Activity: 8-22 mins G-Codes:     Richrd Prime, MPH, MS, OTR/L ascom [phone] 10/19/16, 11:08 AM

## 2016-10-19 NOTE — Progress Notes (Signed)
[facility]   PATIENT NAME: Aaron NorlanderClyde Doyle    MR#:  [id number]  DATE OF BIRTH:  1955/12/13  SUBJECTIVE:  CHIEF COMPLAINT:   Chief Complaint  Patient presents with  . Shortness of Breath  . Leg Swelling   Came with respiratory failure and lethargy, was on BiPAP on admission. Overnight due to worsening intubated and vent support. Extubated 10/18/16. No complains.  sitting comfortably in room, on nasal canula oxygen.  REVIEW OF SYSTEMS:    Review of Systems  Constitutional: Negative for chills and weight loss.  HENT: Negative for congestion, ear discharge and ear pain.   Eyes: Negative for blurred vision and double vision.  Respiratory: Positive for cough, shortness of breath and wheezing.   Cardiovascular: Negative for chest pain, palpitations and leg swelling.  Gastrointestinal: Negative for abdominal pain, diarrhea, nausea and vomiting.  Genitourinary: Negative for dysuria, frequency and hematuria.  Musculoskeletal: Negative for back pain and myalgias.  Neurological: Negative for dizziness, tremors and headaches.  Psychiatric/Behavioral: Negative for depression and substance abuse.    DRUG ALLERGIES:  No Known Allergies  VITALS:  Blood pressure (!) 144/64, pulse 64, temperature 97.7 F (36.5 C), temperature source Axillary, resp. rate (!) 22, height 6' (1.829 m), weight 116.7 kg (257 lb 4.4 oz), SpO2 95 %.  PHYSICAL EXAMINATION:  GENERAL:  61 y.o.-year-old patient lying in the bed with no acute distress.  EYES: Pupils equal, round, reactive to light and accommodation. No scleral icterus. Extraocular muscles intact.  HEENT: Head atraumatic, normocephalic. Oropharynx and nasopharynx clear.  NECK:  Supple, no jugular venous distention. No thyroid enlargement, no tenderness.  LUNGS: Normal breath sounds bilaterally, positive for wheezing , no crepitation. No use of accessory muscles of respiration. On nasal canula oxygen. CARDIOVASCULAR:  S1, S2 normal. No murmurs, rubs, or gallops.  ABDOMEN: Soft, nontender, nondistended. Bowel sounds present. No organomegaly or mass.  EXTREMITIES: No pedal edema, cyanosis, or clubbing.  NEUROLOGIC: Patient is alert, moves all 4 limbs, follows commands power 5/5. PSYCHIATRIC: The patient is alert and oriented.  SKIN: No obvious rash, lesion, or ulcer.   Physical Exam LABORATORY PANEL:   CBC  Recent Labs Lab 10/19/16 0319  WBC 13.5*  HGB 13.3  HCT 40.8  PLT 250   ------------------------------------------------------------------------------------------------------------------  Chemistries   Recent Labs Lab 10/17/16 0517  10/19/16 0319  NA 138  < > 141  K 3.2*  < > 4.2  CL 89*  < > 89*  CO2 37*  < > 41*  GLUCOSE 191*  < > 163*  BUN 14  < > 23*  CREATININE 0.93  < > 0.91  CALCIUM 8.0*  < > 8.3*  MG  --   < > 2.3  AST 24  --   --   ALT 19  --   --   ALKPHOS 82  --   --   BILITOT 0.7  --   --   < > = values in this interval not displayed. ------------------------------------------------------------------------------------------------------------------  Cardiac Enzymes  Recent Labs Lab 10/17/16 0517 10/17/16 1111  TROPONINI 0.03* <0.03   ------------------------------------------------------------------------------------------------------------------  RADIOLOGY:  Dg Chest Port 1 View  Result Date: 10/18/2016 CLINICAL DATA:  Respiratory failure. EXAM: PORTABLE CHEST 1 VIEW COMPARISON:  10/16/2016 . FINDINGS: Endotracheal tube, NG tube in stable position. Heart size stable. Low lung volumes with bibasilar atelectasis. Small left pleural effusion. No pneumothorax. IMPRESSION: 1. Lines and tubes in stable position. 2. Bibasilar subsegmental atelectasis and small left pleural  effusion. No change from prior exam . Electronically Signed   By: Maisie Fus  Register   On: 10/18/2016 06:30    ASSESSMENT AND PLAN:   Principal Problem:   Acute respiratory failure with hypoxia  and hypercapnia (HCC) Active Problems:   COPD (chronic obstructive pulmonary disease) (HCC)   Acute on chronic diastolic CHF (congestive heart failure), NYHA class 1 (HCC)   OSA (obstructive sleep apnea)   Acute respiratory failure (HCC)   Encounter for intubation  * Acute respiratory failure with hypoxia and hypercapnia   Acute COPD exacerbation   Acute on chronic diastolic CHF    Status post intubation, manage per intensivist. Continue Solu-Medrol, bronchodilators, was given Zosyn.   IV Lasix. Echocardiogram reviewed.  extubated now. Better.   May need CPAP at night as per pulm.   Pulm suggested to d/c Zosyn.  * Hypertension    continue carvedilol.  * Hypokalemia   Replace per ICU protocol.  All the records are reviewed and case discussed with Care Management/Social Workerr. Management plans discussed with the patient, family and they are in agreement.  CODE STATUS: Full.  TOTAL TIME TAKING CARE OF THIS PATIENT: 35 minutes.    POSSIBLE D/C IN 1-2 DAYS, DEPENDING ON CLINICAL CONDITION.   Altamese Dilling M.D on 10/19/2016   Between 7am to 6pm - Pager - [phone]  After 6pm go to [email or web address] - password [email or web address] [facility]  [facility]  Office  [phone]  CC: Primary care physician; Abran Richard, PA-C  Note: This dictation was prepared with Dragon dictation along with smaller phrase technology. Any transcriptional errors that result from this process are unintentional.

## 2016-10-19 NOTE — Progress Notes (Signed)
Nutrition Brief Note  Patient identified to be seen for low Braden score  Wt Readings from Last 15 Encounters:  10/19/16 257 lb 4.4 oz (116.7 kg)  10/10/15 276 lb 9.6 oz (125.5 kg)  09/28/15 265 lb 3.4 oz (120.3 kg)  09/02/15 167 lb 14.4 oz (76.2 kg)  06/24/15 262 lb (118.8 kg)  12/28/14 256 lb (65116.661 kg)   61 year old male with PMHx of HLD, HTN, osteoarthritis, gout, glaucoma, CHF, bilateral lower extremity edema, OSA, COPD who presented with SOB found to have AECOPD. Patient was initially placed on BiPAP but was subsequently intubated on 7/21 after failing BiPAP. He was extubated on 7/23 and diet was advanced.  Spoke with patient at bedside. He reports he has a good appetite. He has finished 100% of all three meals he has received since extubation. Patient denies any N/V, abdominal pain, difficulty chewing/swallowing. Reports he also had a good appetite PTA. He typically eats 3 meals per day. He reports he is weight stable except for fluctuations with fluid. Patient reports he is familiar with carbohydrate modified diet and has no questions on diet at this time.  Nutrition-Focused physical exam completed. Findings are no fat depletion, no muscle depletion, and moderate edema.   Discussed with RN.  Body mass index is 34.89 kg/m. Patient meets criteria for Obesity Class II based on current BMI.   Current diet order is Carbohydrate Modified, patient is consuming approximately 100% of meals at this time. Labs and medications reviewed.   No nutrition interventions warranted at this time. If nutrition issues arise, please consult RD.   Helane RimaLeanne Elia Nunley, MS, RD, LDN Pager: [phone] After Hours Pager: [phone]

## 2016-10-19 NOTE — Care Management (Addendum)
Met briefly with patient while PT was doing their evaluation. Patient does not have health insurance. He currently has O[facility] through [facility]; I am checking financial status of this O2 and if patient would need charity care criteria.  I have delivered applications to [facility] and [facility]. Patient is baseline independent and only uses a cane and walker to ambulate "when needed after a recent fall".  RNCM will continue to follow. No payer for PT in outpatient setting. Patient has Medical Family Planning through [facility] Ref # [id number] P.  Medicaid # [id number] N

## 2016-10-19 NOTE — Progress Notes (Signed)
Name: Aaron Doyle MRN: [id number] DOB: 07-11-1955    ADMISSION DATE:  10/16/2016   CHIEF COMPLAINT:  Shortness Of breath  BRIEF PATIENT DESCRIPTION: 61 yo male with COPD,CHF,morbid obesity came with shortness of breath. Was initially placed on BiPAP,Failed BiPAP therefore was intubated.  SIGNIFICANT EVENTS  7/21 Patient came with SOB and lethargy, Placed on BiPAP 7/21 Failed BiPAP 7/21 Intubated 7/23 Extubated   STUDIES:  03/10/15 ECHO>>NORMAL LEFT VENTRICULAR SYSTOLIC FUNCTION WITH MILD LVH MODERATE VALVULAR REGURGITATION (See above) NO VALVULAR STENOSIS MODERATE PHTN EF >55%   SUBJECTIVE: Extubatedyesterday, patient is not on BiPAP  VITAL SIGNS: Temp:  [97.7 F (36.5 C)-98.7 F (37.1 C)] 97.7 F (36.5 C) (07/24 0800) Pulse Rate:  [48-67] 54 (07/24 0856) Resp:  [13-25] 20 (07/24 0856) BP: (104-149)/(62-95) 129/78 (07/24 0800) SpO2:  [88 %-97 %] 95 % (07/24 0856) FiO2 (%):  [40 %-50 %] 45 % (07/24 0856) Weight:  [257 lb 4.4 oz (116.7 kg)] 257 lb 4.4 oz (116.7 kg) (07/24 0500)  PHYSICAL EXAMINATION: General: Morbidly obese, Caucasian male,lethargic, but arousable Neuro: AAO X2, speech is normal, follows commands, moves all extremities HEENT: PERRLA, oral mucosa with moderate secretions Cardiovascular: Regular S1S2, no m/r/g, +2 pulses,+2 edema Lungs:  Bilateral breath sounds, diminished in the bases, no wheezing Abdomen: obese,distended,round Musculoskeletal:  No deformities, no joint swelling Skin: Scabs present all over the body   Recent Labs Lab 10/17/16 0517 10/18/16 0029 10/18/16 0552 10/19/16 0319  NA 138  --  140 141  K 3.2* 3.4* 3.9 4.2  CL 89*  --  91* 89*  CO2 37*  --  38* 41*  BUN 14  --  20 23*  CREATININE 0.93  --  0.94 0.91  GLUCOSE 191*  --  173* 163*    Recent Labs Lab 10/17/16 0517 10/18/16 0552 10/19/16 0319  HGB 13.1 14.1 13.3  HCT 38.5* 43.1 40.8  WBC 9.6 18.2* 13.5*  PLT 248 306 250   Dg Chest Port 1 View  Result  Date: 10/18/2016 CLINICAL DATA:  Respiratory failure. EXAM: PORTABLE CHEST 1 VIEW COMPARISON:  10/16/2016 . FINDINGS: Endotracheal tube, NG tube in stable position. Heart size stable. Low lung volumes with bibasilar atelectasis. Small left pleural effusion. No pneumothorax. IMPRESSION: 1. Lines and tubes in stable position. 2. Bibasilar subsegmental atelectasis and small left pleural effusion. No change from prior exam . Electronically Signed   By: Maisie Fus  Register   On: 10/18/2016 06:30    ASSESSMENT / PLAN:    Acute on chronic hypoxic/hypercarbic  Respiratory failure secondary to COPD exacerbation Obesity Hypoventilation Syndrome Obstructive Sleep Apnea Diastolic Heart Failure Bilateral lower extremity edema Hypertension Hyperlipidemia Tobacco Abuse Echo 10/17/16; EF=60%  Plan Extubated; doing well Continue steroids, taper F/U ECHO results Continue diuretics DisContinue Zosyn; levaquin discontinued 7/21 Monitor fever,cbc Continue Aspirin Continue Lipitor Continue nebulized Bronchodilators Monitor and correct electrolyte imbalances Decrease lasix, start diamox for obesity hypoventilation.  Will need bipap qhs while inpt.  Outpt will need to be started on CPAP.    Wells Guiles, MD.   Board Certified in Internal Medicine, Pulmonary Medicine, Critical Care Medicine, and Sleep Medicine.  [facility] Office Number: [phone] Pager: [phone]  Santiago Glad, M.D.  Billy Fischer, M.D   10/19/2016, 8:59 AM   Physical Exam    Wells Guiles, MD.   Board Certified in Internal Medicine, Pulmonary Medicine, Critical Care Medicine, and Sleep Medicine.  [facility] Office Number: [phone] Pager:  [phone]  Santiago Gladavid Kasa, M.D.  Billy Fischeravid Simonds, M.D

## 2016-10-20 LAB — BASIC METABOLIC PANEL
ANION GAP: 7 (ref 5–15)
BUN: 28 mg/dL — ABNORMAL HIGH (ref 6–20)
CO2: 36 mmol/L — AB (ref 22–32)
Calcium: 8.6 mg/dL — ABNORMAL LOW (ref 8.9–10.3)
Chloride: 97 mmol/L — ABNORMAL LOW (ref 101–111)
Creatinine, Ser: 0.99 mg/dL (ref 0.61–1.24)
GFR calc Af Amer: >60 mL/min (ref >60)
GFR calc non Af Amer: >60 mL/min (ref >60)
GLUCOSE: 159 mg/dL — AB (ref 65–99)
POTASSIUM: 4 mmol/L (ref 3.5–5.1)
Sodium: 140 mmol/L (ref 135–145)

## 2016-10-20 LAB — CBC
HEMATOCRIT: 43.2 % (ref 40.0–52.0)
Hemoglobin: 13.9 g/dL (ref 13.0–18.0)
MCH: 30.7 pg (ref 26.0–34.0)
MCHC: 32.3 g/dL (ref 32.0–36.0)
MCV: 95.1 fL (ref 80.0–100.0)
Platelets: 270 10*3/uL (ref 150–440)
RBC: 4.54 MIL/uL (ref 4.40–5.90)
RDW: 14.3 % (ref 11.5–14.5)
WBC: 14.1 10*3/uL — AB (ref 3.8–10.6)

## 2016-10-20 LAB — GLUCOSE, CAPILLARY
GLUCOSE-CAPILLARY: 141 mg/dL — AB (ref 65–99)
Glucose-Capillary: 158 mg/dL — ABNORMAL HIGH (ref 65–99)
Glucose-Capillary: 185 mg/dL — ABNORMAL HIGH (ref 65–99)

## 2016-10-20 LAB — PROCALCITONIN: Procalcitonin: <0.1 ng/mL

## 2016-10-20 NOTE — Clinical Social Work Note (Addendum)
CSW spoke to patient and discussed SNF placement options.  Patient was informed that since he does not have insurance, patient would have to pay out of pocket for SNF placement.  Patient states he can not afford to pay for SNF, he expressed that he is going to have someone stay with him.  Case manager is aware and assisting with getting home health set up for patient.  Patient states that he will try to apply for Medicare again, and also look at private pay insurance again, CSW informed patient that open enrollment begins in October, and he should see if he will be eligible then.  Patient has only been on disability for about a year.  CSW to sign off please reconsult if other social work needs arise.  Ervin KnackEric R. Daylan Boggess, MSW, Theresia MajorsLCSWA [phone]  10/20/2016 1:10 PM

## 2016-10-20 NOTE — Progress Notes (Signed)
Pt. Was placed on bipap with a 3L bleed in.

## 2016-10-20 NOTE — Progress Notes (Signed)
[facility] [place] [facility]   PATIENT NAME: Janora NorlanderClyde Cass    MR#:  [id number]  DATE OF BIRTH:  [birth year]  SUBJECTIVE:  CHIEF COMPLAINT:   Chief Complaint  Patient presents with  . Shortness of Breath  . Leg Swelling   Came with respiratory failure and lethargy, was on BiPAP on admission.  due to worsening intubated and vent support. Extubated 10/18/16. No complains.  sitting comfortably in room, on nasal canula oxygen.  last night started on cpap- could not tolerated well, so took off- and in morning was very lethargic as per nurse, but lateron was completely awake. Had wheezing on exertion with SOB.  REVIEW OF SYSTEMS:    Review of Systems  Constitutional: Negative for chills and weight loss.  HENT: Negative for congestion, ear discharge and ear pain.   Eyes: Negative for blurred vision and double vision.  Respiratory: Positive for cough, shortness of breath and wheezing.   Cardiovascular: Negative for chest pain, palpitations and leg swelling.  Gastrointestinal: Negative for abdominal pain, diarrhea, nausea and vomiting.  Genitourinary: Negative for dysuria, frequency and hematuria.  Musculoskeletal: Negative for back pain and myalgias.  Neurological: Negative for dizziness, tremors and headaches.  Psychiatric/Behavioral: Negative for depression and substance abuse.    DRUG ALLERGIES:  No Known Allergies  VITALS:  Blood pressure 134/67, pulse 61, temperature 98.1 F (36.7 C), temperature source Oral, resp. rate 20, height 6' (1.829 m), weight 114.9 kg (253 lb 6.4 oz), SpO2 95 %.  PHYSICAL EXAMINATION:  GENERAL:  61 y.o.-year-old patient lying in the bed with no acute distress.  EYES: Pupils equal, round, reactive to light and accommodation. No scleral icterus. Extraocular muscles intact.  HEENT: Head atraumatic, normocephalic. Oropharynx and nasopharynx clear.  NECK:  Supple, no jugular venous distention. No thyroid enlargement, no tenderness.   LUNGS: Normal breath sounds bilaterally, positive for wheezing , no crepitation. No use of accessory muscles of respiration. On nasal canula oxygen. CARDIOVASCULAR: S1, S2 normal. No murmurs, rubs, or gallops.  ABDOMEN: Soft, nontender, nondistended. Bowel sounds present. No organomegaly or mass.  EXTREMITIES: No pedal edema, cyanosis, or clubbing.  NEUROLOGIC: Patient is alert, moves all 4 limbs, follows commands power 5/5. PSYCHIATRIC: The patient is alert and oriented.  SKIN: No obvious rash, lesion, or ulcer.   Physical Exam LABORATORY PANEL:   CBC  Recent Labs Lab 10/20/16 0714  WBC 14.1*  HGB 13.9  HCT 43.2  PLT 270   ------------------------------------------------------------------------------------------------------------------  Chemistries   Recent Labs Lab 10/17/16 0517  10/19/16 0319 10/20/16 0714  NA 138  < > 141 140  K 3.2*  < > 4.2 4.0  CL 89*  < > 89* 97*  CO2 37*  < > 41* 36*  GLUCOSE 191*  < > 163* 159*  BUN 14  < > 23* 28*  CREATININE 0.93  < > 0.91 0.99  CALCIUM 8.0*  < > 8.3* 8.6*  MG  --   < > 2.3  --   AST 24  --   --   --   ALT 19  --   --   --   ALKPHOS 82  --   --   --   BILITOT 0.7  --   --   --   < > = values in this interval not displayed. ------------------------------------------------------------------------------------------------------------------  Cardiac Enzymes  Recent Labs Lab 10/17/16 0517 10/17/16 1111  TROPONINI 0.03* <0.03   ------------------------------------------------------------------------------------------------------------------  RADIOLOGY:  No results found.  ASSESSMENT AND  PLAN:   Principal Problem:   Acute respiratory failure with hypoxia and hypercapnia (HCC) Active Problems:   COPD (chronic obstructive pulmonary disease) (HCC)   Acute on chronic diastolic CHF (congestive heart failure), NYHA class 1 (HCC)   OSA (obstructive sleep apnea)   Acute respiratory failure (HCC)   Encounter for  intubation  * Acute respiratory failure with hypoxia and hypercapnia   Acute COPD exacerbation   Acute on chronic diastolic CHF    Status post intubation, manage per intensivist. Continue Solu-Medrol, bronchodilators, was given Zosyn.   IV Lasix. Echocardiogram reviewed.  extubated now. Better.   May need CPAP at night as per pulm.   Pulm suggested to d/c Zosyn.   Have some intolerance with CPAP- but explained the importance of that, and ready to use it.    He had sleep study done one year ago.    Prescribed CPAP as out pt, case manager is arranging for that.  * Hypertension    continue carvedilol.  * Hypokalemia   Replace per ICU protocol.    Normal now.  All the records are reviewed and case discussed with Care Management/Social Workerr. Management plans discussed with the patient, family and they are in agreement.  CODE STATUS: Full.  TOTAL TIME TAKING CARE OF THIS PATIENT: 35 minutes.    POSSIBLE D/C IN 1-2 DAYS, DEPENDING ON CLINICAL CONDITION.   Altamese DillingVACHHANI, Renise Gillies M.D on 10/20/2016   Between 7am to 6pm - Pager - [phone]  After 6pm go to [email or web address] - password [email or web address] [facility]  [facility]  Office  [phone]  CC: Primary care physician; Abran RichardBaucom, Jenny B, PA-C  Note: This dictation was prepared with Dragon dictation along with smaller phrase technology. Any transcriptional errors that result from this process are unintentional.

## 2016-10-20 NOTE — Progress Notes (Signed)
Spoke with pt about Bipap at bedside. Pt states that he is not ready to place for sleep but to return around 11pm to place. Danford BadKristie, RRT aware and will place.

## 2016-10-20 NOTE — Care Management (Addendum)
Patient transferred out of icu.  Patient was receiving medicaid until last year when his disability started.  His monthly income is 1867.00.  Has access to a walker and cane and he [place]  [place].  He is not eligible for [facility] unless he is going to be followed by a [place] physician.   Says he had a sleep study ordered by [place] pulmonary  [facility]  [facility] will assess for charity care.  Physical therapy has recommended skilled nursing. OT consult pending.  Patient says he had a [facility] last year [facility] Dr Nicholos Johnsamachandran.  Contacted office and patient cancelled all of his appointments and was never seen.  Contacted Cardiology - Dr Juliann Paresallwood- as informed he ordered the sleep study last year- asking to have these results faxed to unit.  Patient says he will not be alone when discharges home- will have caregiver support round the clock..  Says the reason "it was slow going with therapist" is because he has gout  and once he gets going, the pain is less. Therapist and primary nurse report patient was very short of breath during the therapy session.  [facility] is assessing for charity care. Asking to investigate whether cpap can be provided.  Patient was without cpap last night (he declined)  and this morning he ws very confused and lethargic which resolved when he was placed back on cpap.  Patient will have to navigate 4 steps to get inside the house.  He will have transportation home.

## 2016-10-20 NOTE — Progress Notes (Signed)
Patient refusing to get temperature and labs taken. Arousing to voice and oriented x 2. Disoriented to place and situation. Patient removed CPAP and declined to continue wearing it due to discomfort. Respiratory and self attempted to readjust CPAP several times throughout night.  MD notified of change. Discussed encouraging patient to place CPAP back on. Patient declined. Will monitor

## 2016-10-21 ENCOUNTER — Inpatient Hospital Stay: Payer: Self-pay

## 2016-10-21 LAB — BASIC METABOLIC PANEL
ANION GAP: 7 (ref 5–15)
BUN: 35 mg/dL — ABNORMAL HIGH (ref 6–20)
CALCIUM: 9 mg/dL (ref 8.9–10.3)
CO2: 32 mmol/L (ref 22–32)
CREATININE: 0.9 mg/dL (ref 0.61–1.24)
Chloride: 99 mmol/L — ABNORMAL LOW (ref 101–111)
GLUCOSE: 225 mg/dL — AB (ref 65–99)
Potassium: 4.2 mmol/L (ref 3.5–5.1)
Sodium: 138 mmol/L (ref 135–145)

## 2016-10-21 LAB — GLUCOSE, CAPILLARY
GLUCOSE-CAPILLARY: 192 mg/dL — AB (ref 65–99)
GLUCOSE-CAPILLARY: 229 mg/dL — AB (ref 65–99)
Glucose-Capillary: 154 mg/dL — ABNORMAL HIGH (ref 65–99)
Glucose-Capillary: 233 mg/dL — ABNORMAL HIGH (ref 65–99)

## 2016-10-21 MED ORDER — CARVEDILOL 3.125 MG PO TABS
3.1250 mg | ORAL_TABLET | Freq: Two times a day (BID) | ORAL | Status: DC
  Administered 2016-10-21 – 2016-10-22 (×2): 3.125 mg via ORAL
  Filled 2016-10-21 (×2): qty 1

## 2016-10-21 NOTE — Plan of Care (Signed)
Problem: Safety: Goal: Ability to remain free from injury will improve Outcome: Progressing Fall precautions in place, non skid socks when oob  Problem: Pain Managment: Goal: General experience of comfort will improve Outcome: Progressing Prn medications  Problem: Tissue Perfusion: Goal: Risk factors for ineffective tissue perfusion will decrease Outcome: Progressing SQ Lovenox

## 2016-10-21 NOTE — Care Management (Signed)
Have spoken with Vista LawmanBetsey Sweetser with [facility] regarding obtaining cpap today rather than waiting for appointment July 31

## 2016-10-21 NOTE — Progress Notes (Signed)
OT Cancellation Note  Patient Details Name: Aaron Doyle MRN: [id number] DOB: 12-17-1955   Cancelled Treatment:    Reason Eval/Treat Not Completed: Patient at procedure or test/ unavailable. Pt unavailable, with respiratory upon attempt. Recently declined PT due to fatigue. Will re-attempt OT treatment at later date/time as pt is available.  Richrd PrimeJamie Stiller, MPH, MS, OTR/L ascom [phone] 10/21/16, 11:30 AM

## 2016-10-21 NOTE — Progress Notes (Signed)
PT Cancellation Note  Patient Details Name: Aaron Doyle MRN: [id number] DOB: 1956/03/14   Cancelled Treatment:    Reason Eval/Treat Not Completed: Fatigue/lethargy limiting ability to participate   Pt offered and encouraged therapy session this am.  Pt stated he just returned to bed from walking to/from bathroom with nursing and attending to bathroom tasks including bathing.  Reports too fatigued to participate this am and requested afternoon session.  Will try to accommodate pt as schedule allows.   Danielle DessSarah Khalib Fendley 10/21/2016, 10:22 AM

## 2016-10-21 NOTE — Progress Notes (Signed)
Pt remains on Bipap. Asleep and tol well at this time.

## 2016-10-21 NOTE — Progress Notes (Signed)
Pt taken off of Bipap per pt request. Placed on O2 @ 2.5 liters per nasal cannula.

## 2016-10-21 NOTE — Progress Notes (Signed)
[facility] [place] [facility]   PATIENT NAME: Aaron NorlanderClyde Doyle    MR#:  [id number]  DATE OF BIRTH:  June 28, 1955  SUBJECTIVE:  CHIEF COMPLAINT:   Chief Complaint  Patient presents with  . Shortness of Breath  . Leg Swelling   Came with respiratory failure and lethargy, was on BiPAP on admission.  due to worsening intubated and vent support. Extubated 10/18/16. No complains.  sitting comfortably in room, on nasal canula oxygen. Had wheezing on exertion with SOB. And felt weak.  REVIEW OF SYSTEMS:    Review of Systems  Constitutional: Negative for chills and weight loss.  HENT: Negative for congestion, ear discharge and ear pain.   Eyes: Negative for blurred vision and double vision.  Respiratory: Positive for cough, shortness of breath and wheezing (minimal).   Cardiovascular: Negative for chest pain, palpitations and leg swelling.  Gastrointestinal: Negative for abdominal pain, diarrhea, nausea and vomiting.  Genitourinary: Negative for dysuria, frequency and hematuria.  Musculoskeletal: Negative for back pain and myalgias.  Neurological: Negative for dizziness, tremors and headaches.  Psychiatric/Behavioral: Negative for depression and substance abuse.    DRUG ALLERGIES:  No Known Allergies  VITALS:  Blood pressure (!) 111/50, pulse 66, temperature 98.2 F (36.8 C), temperature source Oral, resp. rate 20, height 6' (1.829 m), weight 113.3 kg (249 lb 12.8 oz), SpO2 95 %.  PHYSICAL EXAMINATION:  GENERAL:  61 y.o.-year-old patient lying in the bed with no acute distress.  EYES: Pupils equal, round, reactive to light and accommodation. No scleral icterus. Extraocular muscles intact.  HEENT: Head atraumatic, normocephalic. Oropharynx and nasopharynx clear.  NECK:  Supple, no jugular venous distention. No thyroid enlargement, no tenderness.  LUNGS: Normal breath sounds bilaterally, positive for wheezing , no crepitation. No use of accessory muscles of  respiration. On nasal canula oxygen. CARDIOVASCULAR: S1, S2 normal. No murmurs, rubs, or gallops.  ABDOMEN: Soft, nontender, nondistended. Bowel sounds present. No organomegaly or mass.  EXTREMITIES: No pedal edema, cyanosis, or clubbing.  NEUROLOGIC: Patient is alert, moves all 4 limbs, follows commands power 5/5. PSYCHIATRIC: The patient is alert and oriented.  SKIN: No obvious rash, lesion, or ulcer.   Physical Exam LABORATORY PANEL:   CBC  Recent Labs Lab 10/20/16 0714  WBC 14.1*  HGB 13.9  HCT 43.2  PLT 270   ------------------------------------------------------------------------------------------------------------------  Chemistries   Recent Labs Lab 10/17/16 0517  10/19/16 0319  10/21/16 1607  NA 138  < > 141  < > 138  K 3.2*  < > 4.2  < > 4.2  CL 89*  < > 89*  < > 99*  CO2 37*  < > 41*  < > 32  GLUCOSE 191*  < > 163*  < > 225*  BUN 14  < > 23*  < > 35*  CREATININE 0.93  < > 0.91  < > 0.90  CALCIUM 8.0*  < > 8.3*  < > 9.0  MG  --   < > 2.3  --   --   AST 24  --   --   --   --   ALT 19  --   --   --   --   ALKPHOS 82  --   --   --   --   BILITOT 0.7  --   --   --   --   < > = values in this interval not displayed. ------------------------------------------------------------------------------------------------------------------  Cardiac Enzymes  Recent Labs Lab 10/17/16 [phone]  10/17/16 1111  TROPONINI 0.03* <0.03   ------------------------------------------------------------------------------------------------------------------  RADIOLOGY:  No results found.  ASSESSMENT AND PLAN:   Principal Problem:   Acute respiratory failure with hypoxia and hypercapnia (HCC) Active Problems:   COPD (chronic obstructive pulmonary disease) (HCC)   Acute on chronic diastolic CHF (congestive heart failure), NYHA class 1 (HCC)   OSA (obstructive sleep apnea)   Acute respiratory failure (HCC)   Encounter for intubation  * Acute respiratory failure with hypoxia  and hypercapnia   Acute COPD exacerbation   Acute on chronic diastolic CHF    Status post intubation, manage per intensivist. Continue Solu-Medrol, bronchodilators, was given Zosyn.   IV Lasix. Echocardiogram reviewed.  extubated now. Better.   May need CPAP at night as per pulm.   Pulm suggested to d/c Zosyn.   Have some intolerance with CPAP- but explained the importance of that, and ready to use it.    He had sleep study done one year ago.    Prescribed CPAP as out pt, case manager arranged for that.    He is already on home oxygen.   Had some weakness and SOB with walking, so will cont in hospital therapy for one day and d/c likely tomorrow.  * Hypertension    continue carvedilol.  * Hypokalemia   Replace per ICU protocol.    Normal now.  All the records are reviewed and case discussed with Care Management/Social Workerr. Management plans discussed with the patient, family and they are in agreement.  CODE STATUS: Full.  TOTAL TIME TAKING CARE OF THIS PATIENT: 35 minutes.    POSSIBLE D/C IN 1-2 DAYS, DEPENDING ON CLINICAL CONDITION.   Altamese DillingVACHHANI, Aalina Brege M.D on 10/21/2016   Between 7am to 6pm - Pager - [phone]  After 6pm go to [email or web address] - password [email or web address] [facility]  [facility]  Office  [phone]  CC: Primary care physician; Abran RichardBaucom, Jenny B, PA-C  Note: This dictation was prepared with Dragon dictation along with smaller phrase technology. Any transcriptional errors that result from this process are unintentional.

## 2016-10-21 NOTE — Progress Notes (Signed)
Physical Therapy Treatment Patient Details Name: Aaron Doyle MRN: [id number] DOB: October 16, 1955 Today's Date: 10/21/2016    History of Present Illness presented to ER secondary to 3-day history of SOB, DOE; admitted with acute respiratory failure related to CHF, COPD exacerbation requiring intubation 7/21-7/23; now weaned to 4L supplemental O2 via [place]    PT Comments    Pt is pleasant and eager to participate for return to PLOF.  Pt performs bed mobility with ModI, tranfers with supervision, and ambulation with CGA, due to impaired strength, balance, endurance, and pain. Pt amb total of 160 ft, with B UE support from RW and 3L supplemental O2. O2 saturation dropped to 87% at ~110 ft, and pt was given cues to practice pursed lipped breathing, immediately bringing O2 saturation to >90%.  Pt has demo much improvement towards metting functional goals since prior session. Overall, pt responded well to today's treatment with no adverse affects. Pt would benefit from skilled PT to address the previously mentioned impairments and promote return to PLOF. Currently recommending HHPT, pending d/c.     Follow Up Recommendations  Home health PT     Equipment Recommendations  Rolling walker with 5" wheels    Recommendations for Other Services       Precautions / Restrictions Precautions Precautions: Fall Restrictions Weight Bearing Restrictions: No    Mobility  Bed Mobility Overal bed mobility: Modified Independent Bed Mobility: Supine to Sit     Supine to sit: Modified independent (Device/Increase time)     General bed mobility comments: Pt ModI with supine to sit with HOB elevated, not requiring use of bed rail. Pt requires no cues for safety or body mechanics.   Transfers Overall transfer level: Needs assistance Equipment used: Rolling walker (2 wheeled) Transfers: Sit to/from Stand Sit to Stand: Supervision         General transfer comment: Supervision with STS, requiring  increased time and B UE support from RW. Pt requiring no cues for mechancis and safety.   Ambulation/Gait Ambulation/Gait assistance: Min guard Ambulation Distance (Feet): 160 Feet Assistive device: Rolling walker (2 wheeled) Gait Pattern/deviations: Step-through pattern     General Gait Details: Pt with good reciprocal gait pattern, with min cues for standing close to RW and standing with erect posture. Pt with good carryover. 3L supplemental O2 utilized during amb, with O2 sat dropping to 87%. Upon sitting and cues to perform pursed lipped breathing, saturation quickly recovered to >90%.    Stairs            Wheelchair Mobility    Modified Rankin (Stroke Patients Only)       Balance                                            Cognition Arousal/Alertness: Awake/alert Behavior During Therapy: WFL for tasks assessed/performed Overall Cognitive Status: Within Functional Limits for tasks assessed                                        Exercises Other Exercises Other Exercises: Supine and standing therex performed to B LE's with supervision x12 reps: ankle pumps, quad sets, glute sets, SLR, hip abd, and stadning marches in place. Pt with good mechancis and safety, requiring no cues. Pt on 2.5L supplemental O2 during therex,  maintaining saturation >90% throughout.  Other Exercises: (P) Toileting: Pt supervision for hygiene and clothing management, requiring no cues for mechancis and safety.     General Comments        Pertinent Vitals/Pain Pain Assessment: Faces Faces Pain Scale: Hurts little more Pain Location: R  knee Pain Descriptors / Indicators: Aching;Grimacing Pain Intervention(s): Limited activity within patient's tolerance;Monitored during session;Repositioned    Home Living                      Prior Function            PT Goals (current goals can now be found in the care plan section) Acute Rehab PT  Goals Patient Stated Goal: feel better so I can go home PT Goal Formulation: With patient Time For Goal Achievement: 11/02/16 Potential to Achieve Goals: Good Progress towards PT goals: Progressing toward goals    Frequency    Min 2X/week      PT Plan Current plan remains appropriate    Co-evaluation              AM-PAC PT "6 Clicks" Daily Activity  Outcome Measure  Difficulty turning over in bed (including adjusting bedclothes, sheets and blankets)?: A Little Difficulty moving from lying on back to sitting on the side of the bed? : A Little Difficulty sitting down on and standing up from a chair with arms (e.g., wheelchair, bedside commode, etc,.)?: A Little Help needed moving to and from a bed to chair (including a wheelchair)?: A Little Help needed walking in hospital room?: A Little Help needed climbing 3-5 steps with a railing? : A Lot 6 Click Score: 17    End of Session Equipment Utilized During Treatment: Gait belt;Oxygen Activity Tolerance: Patient tolerated treatment well Patient left: in chair;with call bell/phone within reach;with chair alarm set Nurse Communication: Mobility status;Other (comment) (need for pillow beneath heels to relieve pressure. ) PT Visit Diagnosis: Difficulty in walking, not elsewhere classified (R26.2);Muscle weakness (generalized) (M62.81)     Time: [id number] PT Time Calculation (min) (ACUTE ONLY): 31 min  Charges:                       G Codes:       Sharman CheekLaura Naythan Douthit PT, SPT   Latanya MaudlinLaura M Izora Benn 10/21/2016, 3:13 PM

## 2016-10-21 NOTE — Care Management (Signed)
[facility] will be able to obtain cpap for patient and he will not have to wait until next week.  Spoke with daughter Sonny MastersCandace and now anticipate discharge within next 24 hours due to continued shortness of breath.  Discussed that CM would assess for discharge medications and need for assist.  Will asked physical therapy to provide written home exercise program.  Confirmed with Candace that patient does have a walker at home

## 2016-10-21 NOTE — Progress Notes (Signed)
To xray via bed 

## 2016-10-22 LAB — GLUCOSE, CAPILLARY
GLUCOSE-CAPILLARY: 162 mg/dL — AB (ref 65–99)
GLUCOSE-CAPILLARY: 246 mg/dL — AB (ref 65–99)

## 2016-10-22 LAB — CULTURE, BLOOD (ROUTINE X 2)
CULTURE: NO GROWTH
Culture: NO GROWTH
SPECIAL REQUESTS: ADEQUATE
SPECIAL REQUESTS: ADEQUATE

## 2016-10-22 MED ORDER — FUROSEMIDE 20 MG PO TABS: 20.0000 mg | ORAL_TABLET | Freq: Two times a day (BID) | ORAL | 1 refills | Status: DC

## 2016-10-22 MED ORDER — ALBUTEROL SULFATE (2.5 MG/3ML) 0.083% IN NEBU: 2.5000 mg | INHALATION_SOLUTION | Freq: Four times a day (QID) | RESPIRATORY_TRACT | 2 refills | Status: AC | PRN

## 2016-10-22 MED ORDER — MOMETASONE FURO-FORMOTEROL FUM 100-5 MCG/ACT IN AERO: 2.0000 | INHALATION_SPRAY | Freq: Two times a day (BID) | RESPIRATORY_TRACT | 0 refills | Status: DC

## 2016-10-22 MED ORDER — CARVEDILOL 3.125 MG PO TABS: 3.1250 mg | ORAL_TABLET | Freq: Two times a day (BID) | ORAL | 0 refills | Status: AC

## 2016-10-22 MED ORDER — POTASSIUM CHLORIDE CRYS ER 20 MEQ PO TBCR: 20.0000 meq | EXTENDED_RELEASE_TABLET | Freq: Every day | ORAL | 0 refills | Status: DC

## 2016-10-22 MED ORDER — PREDNISONE 10 MG (21) PO TBPK: ORAL_TABLET | ORAL | 0 refills | Status: DC

## 2016-10-22 NOTE — Progress Notes (Signed)
Physical Therapy Treatment Patient Details Name: Aaron Doyle MRN: [id number] DOB: 09/09/1955 Today's Date: 10/22/2016    History of Present Illness presented to ER secondary to 3-day history of SOB, DOE; admitted with acute respiratory failure related to CHF, COPD exacerbation requiring intubation 7/21-7/23; now weaned to 4L supplemental O2 via [place]    PT Comments    Pt is pleasant and willing to participate for return to PLOF.  Bed mobility, tranfers, and ambulation not performed this session, as primary focus was education on HEP and HEP progression. Pt demo good technique with all supine therex and verbalized understanding of all seated therex, reporting no questions. Overall, pt responded well to today's treatment with no adverse affects, and is progressing well towards goals. Pt would benefit from skilled PT to address the previously mentioned impairments and promote return to PLOF. Currently recommending HHPT, pending d/c.      Follow Up Recommendations  Home health PT     Equipment Recommendations  Rolling walker with 5" wheels    Recommendations for Other Services       Precautions / Restrictions Precautions Precautions: Fall Restrictions Weight Bearing Restrictions: No    Mobility  Bed Mobility               General bed mobility comments: Pt bed mobility goals met. Not performed this session.   Transfers                 General transfer comment: Transfers not performed this session, as focus was on HEP education and performance.  Ambulation/Gait             General Gait Details: Amb not performed this session, as focus was on HEP education and performance.   Stairs            Wheelchair Mobility    Modified Rankin (Stroke Patients Only)       Balance                                            Cognition Arousal/Alertness: Awake/alert Behavior During Therapy: WFL for tasks assessed/performed Overall Cognitive  Status: Within Functional Limits for tasks assessed                                        Exercises Other Exercises Other Exercises: Supine therex performed to B LE's with supervision 5-10 reps: ankle pumps, quad sets, glute sets, SLR, hip abd, SAQ's, and hip add pillow squeezes. Pt demo good mechancis and safety. Pt education re: supine and seated therex packet: pt verbalize and demo good understadning.     General Comments        Pertinent Vitals/Pain Pain Assessment: No/denies pain    Home Living                      Prior Function            PT Goals (current goals can now be found in the care plan section) Acute Rehab PT Goals Patient Stated Goal: feel better so I can go home PT Goal Formulation: With patient Time For Goal Achievement: 11/02/16 Potential to Achieve Goals: Good Progress towards PT goals: Progressing toward goals    Frequency    Min 2X/week  PT Plan Current plan remains appropriate    Co-evaluation              AM-PAC PT "6 Clicks" Daily Activity  Outcome Measure  Difficulty turning over in bed (including adjusting bedclothes, sheets and blankets)?: A Little Difficulty moving from lying on back to sitting on the side of the bed? : A Little Difficulty sitting down on and standing up from a chair with arms (e.g., wheelchair, bedside commode, etc,.)?: A Little Help needed moving to and from a bed to chair (including a wheelchair)?: A Little Help needed walking in hospital room?: A Little Help needed climbing 3-5 steps with a railing? : A Lot 6 Click Score: 17    End of Session Equipment Utilized During Treatment: Gait belt;Oxygen Activity Tolerance: Patient tolerated treatment well Patient left: in bed;with call bell/phone within reach;with bed alarm set;with family/visitor present Nurse Communication: Mobility status;Other (comment) PT Visit Diagnosis: Difficulty in walking, not elsewhere classified  (R26.2);Muscle weakness (generalized) (M62.81)     Time: 0413-6438 PT Time Calculation (min) (ACUTE ONLY): 9 min  Charges:                       G Codes:       Oran Rein PT, SPT   Bevelyn Ngo 10/22/2016, 5:07 PM

## 2016-10-22 NOTE — Progress Notes (Signed)
Occupational Therapy Treatment Patient Details Name: Aaron Doyle MRN: [id number] DOB: [birth year] Today's Date: 10/22/2016    History of present illness presented to ER secondary to 3-day history of SOB, DOE; admitted with acute respiratory failure related to CHF, COPD exacerbation requiring intubation 7/21-7/23; now weaned to 4L supplemental O2 via [place]   OT comments  Patient seen this date for ADL session with implementation of energy conservation techniques.  Patient requires min guard to supervision for transfers from the toilet and use of grab bars.  He is able to perform grooming in standing with min guard to supervision.  He demonstrates understanding of energy conservation principles and plans to implement at home and states, "These really work".  Patient continues to progress well with self care tasks and functional mobility and would benefit from continued OT services at home with home health to further increase independence in daily tasks.   Follow Up Recommendations  Home health OT    Equipment Recommendations  3 in 1 bedside commode;Toilet riser;Other (comment);Tub/shower seat    Recommendations for Other Services      Precautions / Restrictions Precautions Precautions: Fall Restrictions Weight Bearing Restrictions: No       Mobility Bed Mobility Overal bed mobility: Modified Independent Bed Mobility: Supine to Sit     Supine to sit: Modified independent (Device/Increase time)        Transfers Overall transfer level: Needs assistance Equipment used: Rolling walker (2 wheeled) Transfers: Sit to/from Stand Sit to Stand: Supervision         General transfer comment: Min guard and use of grab bars for toileting in the bathroom    Balance Overall balance assessment: Needs assistance Sitting-balance support: No upper extremity supported;Feet supported Sitting balance-Leahy Scale: Good     Standing balance support: Bilateral upper extremity  supported Standing balance-Leahy Scale: Good                             ADL either performed or assessed with clinical judgement   ADL Overall ADL's : Needs assistance/impaired Eating/Feeding: Sitting;Set up   Grooming: Min guard;Standing                   Toilet Transfer: Min guard   Toileting- ArchitectClothing Manipulation and Hygiene: Product/process development scientistMin guard               Vision       Perception     Praxis      Cognition Arousal/Alertness: Awake/alert Behavior During Therapy: WFL for tasks assessed/performed Overall Cognitive Status: Within Functional Limits for tasks assessed                                          Exercises     Shoulder Instructions       General Comments      Pertinent Vitals/ Pain       Pain Assessment: No/denies pain Pain Score: 0-No pain  Home Living                                          Prior Functioning/Environment              Frequency  Min 1X/week        Progress Toward  Goals  OT Goals(current goals can now be found in the care plan section)  Progress towards OT goals: Progressing toward goals  Acute Rehab OT Goals Patient Stated Goal: feel better so I can go home OT Goal Formulation: With patient Time For Goal Achievement: 11/02/16 Potential to Achieve Goals: Good  Plan Discharge plan remains appropriate    Co-evaluation                 AM-PAC PT "6 Clicks" Daily Activity     Outcome Measure   Help from another person eating meals?: None Help from another person taking care of personal grooming?: None Help from another person toileting, which includes using toliet, bedpan, or urinal?: A Little Help from another person bathing (including washing, rinsing, drying)?: A Little Help from another person to put on and taking off regular upper body clothing?: None Help from another person to put on and taking off regular lower body clothing?: A Little 6 Click  Score: 21    End of Session Equipment Utilized During Treatment: Gait belt;Oxygen  OT Visit Diagnosis: Repeated falls (R29.6);Other abnormalities of gait and mobility (R26.89);Pain Pain - Right/Left: Right Pain - part of body: Knee   Activity Tolerance Patient tolerated treatment well   Patient Left in chair;with chair alarm set   Nurse Communication          Time: [phone] OT Time Calculation (min): 25 min  Charges: OT General Charges $OT Visit: 1 Procedure OT Treatments $Self Care/Home Management : 23-37 mins  Aaron Doyle, OTR/L, CLT    Aaron Doyle 10/22/2016, 9:41 AM

## 2016-10-22 NOTE — Plan of Care (Signed)
Problem: Safety: Goal: Ability to remain free from injury will improve Outcome: Progressing Fall precautions in place, non skid socks when oob  Problem: Pain Managment: Goal: General experience of comfort will improve Outcome: Progressing Prn medications  Problem: Tissue Perfusion: Goal: Risk factors for ineffective tissue perfusion will decrease Outcome: Progressing SQ Lovenox   

## 2016-10-22 NOTE — Discharge Instructions (Signed)
[facility] appointment on November 01 2016 at 9:00am with Clarisa Kindredina Hackney, FNP. Please call [phone] to reschedule.  CPAP use at night.

## 2016-10-22 NOTE — Care Management (Signed)
[facility]axed patient's [facility]. He will be followed by his pcp in [place] .  Asking for physical therapy to provide written home exercise program.  Patient has a home nebulizer and has transportation home.  Cpap will be delivered to patient's home today. [facility] notified of discharge.

## 2016-10-25 NOTE — Discharge Summary (Signed)
[facility] - [place] at [facility]   PATIENT NAME: Aaron Doyle    MR#:  [id number]  DATE OF BIRTH:  1955-07-14  DATE OF ADMISSION:  10/16/2016 ADMITTING PHYSICIAN: Marguarite Arbour, MD  DATE OF DISCHARGE: 10/22/2016  4:56 PM  PRIMARY CARE PHYSICIAN: Abran Richard, PA-C    ADMISSION DIAGNOSIS:  Chest pain [R07.9] Acute on chronic diastolic CHF (congestive heart failure), NYHA class 1 (HCC) [I50.33]  DISCHARGE DIAGNOSIS:  Principal Problem:   Acute respiratory failure with hypoxia and hypercapnia (HCC) Active Problems:   COPD (chronic obstructive pulmonary disease) (HCC)   Acute on chronic diastolic CHF (congestive heart failure), NYHA class 1 (HCC)   OSA (obstructive sleep apnea)   Acute respiratory failure (HCC)   Encounter for intubation   SECONDARY DIAGNOSIS:   Past Medical History:  Diagnosis Date  . Bilateral lower extremity edema   . CHF (congestive heart failure) (HCC)   . Chronic gout   . Colon polyp   . Glaucoma   . Heart murmur   . Hypertension   . Mild pulmonary hypertension (HCC)   . Mixed hyperlipidemia   . Nonrheumatic mitral valve disorder   . Obesity   . Obstructive sleep apnea   . Osteoarthritis of knee   . Shortness of breath   . Smoking     HOSPITAL COURSE:   * Acute respiratory failure with hypoxia and hypercapnia   Acute COPD exacerbation   Acute on chronic diastolic CHF    Status post intubation, manage per intensivist. Continue Solu-Medrol, bronchodilators, was given Zosyn.   IV Lasix. Echocardiogram reviewed.  extubated now. Better.   May need CPAP at night as per pulm.   Pulm suggested to d/c Zosyn.   Have some intolerance with CPAP- but explained the importance of that, and ready to use it.    He had sleep study done one year ago.    Prescribed CPAP as out pt, case manager arranged for that.    He is already on home oxygen.    * Hypertension    continue carvedilol.  * Hypokalemia   Replace  per ICU protocol.    Normal now.  DISCHARGE CONDITIONS:   Stable.  CONSULTS OBTAINED:  Treatment Team:  Marcina Millard, MD  DRUG ALLERGIES:  No Known Allergies  DISCHARGE MEDICATIONS:   Discharge Medication List as of 10/22/2016  2:03 PM    START taking these medications   Details  albuterol (PROVENTIL) (2.5 MG/3ML) 0.083% nebulizer solution Take 3 mLs (2.5 mg total) by nebulization every 6 (six) hours as needed for wheezing or shortness of breath., Starting Fri 10/22/2016, Print    furosemide (LASIX) 20 MG tablet Take 1 tablet (20 mg total) by mouth 2 (two) times daily., Starting Fri 10/22/2016, Until Sat 10/22/2017, Print    potassium chloride SA (K-DUR,KLOR-CON) 20 MEQ tablet Take 1 tablet (20 mEq total) by mouth daily., Starting Fri 10/22/2016, Print    predniSONE (STERAPRED UNI-PAK 21 TAB) 10 MG (21) TBPK tablet Take 6 tabs first day, 5 tab on day 2, then 4 on day 3rd, 3 tabs on day 4th , 2 tab on day 5th, and 1 tab on 6th day., Print      CONTINUE these medications which have CHANGED   Details  carvedilol (COREG) 3.125 MG tablet Take 1 tablet (3.125 mg total) by mouth 2 (two) times daily with a meal., Starting Fri 10/22/2016, Print    mometasone-formoterol (DULERA) 100-5 MCG/ACT AERO Inhale 2 puffs into  the lungs 2 (two) times daily., Starting Fri 10/22/2016, Print      CONTINUE these medications which have NOT CHANGED   Details  allopurinol (ZYLOPRIM) 300 MG tablet Take 300 mg by mouth at bedtime., Until Discontinued, Historical Med    aspirin EC 81 MG tablet Take 81 mg by mouth daily., Until Discontinued, Historical Med    atorvastatin (LIPITOR) 40 MG tablet Take 40 mg by mouth daily., Until Discontinued, Historical Med    tiotropium (SPIRIVA) 18 MCG inhalation capsule Place 1 capsule (18 mcg total) into inhaler and inhale daily., Starting 09/04/2015, Until Discontinued, Print      STOP taking these medications     torsemide (DEMADEX) 20 MG tablet       Travoprost, BAK Free, (TRAVATAN) 0.004 % SOLN ophthalmic solution          DISCHARGE INSTRUCTIONS:    Follow with pulm clinic in 1-2 weeks.  If you experience worsening of your admission symptoms, develop shortness of breath, life threatening emergency, suicidal or homicidal thoughts you must seek medical attention immediately by calling 911 or calling your MD immediately  if symptoms less severe.  You Must read complete instructions/literature along with all the possible adverse reactions/side effects for all the Medicines you take and that have been prescribed to you. Take any new Medicines after you have completely understood and accept all the possible adverse reactions/side effects.   Please note  You were cared for by a hospitalist during your hospital stay. If you have any questions about your discharge medications or the care you received while you were in the hospital after you are discharged, you can call the unit and asked to speak with the hospitalist on call if the hospitalist that took care of you is not available. Once you are discharged, your primary care physician will handle any further medical issues. Please note that NO REFILLS for any discharge medications will be authorized once you are discharged, as it is imperative that you return to your primary care physician (or establish a relationship with a primary care physician if you do not have one) for your aftercare needs so that they can reassess your need for medications and monitor your lab values.    Today   CHIEF COMPLAINT:   Chief Complaint  Patient presents with  . Shortness of Breath  . Leg Swelling    HISTORY OF PRESENT ILLNESS:  Aaron Doyle  is a 61 y.o. male with a known history of HTN, OSA, morbid obesity, and COPD now with 3 day hx of worsening SOB with lethargy. In ER, pt hypoxic and hypercapneic requiring BiPAP. He is now admitted. No fever. Denies CP. Has worsening LE edema. CXR in ER shows no  pneumonia but suggests mild CHF. He is now admitted.   VITAL SIGNS:  Blood pressure 134/72, pulse 77, temperature 98.1 F (36.7 C), temperature source Oral, resp. rate 17, height 6' (1.829 m), weight 111.9 kg (246 lb 11.2 oz), SpO2 100 %.  I/O:  No intake or output data in the 24 hours ending 10/25/16 0746  PHYSICAL EXAMINATION:   GENERAL:  61 y.o.-year-old patient lying in the bed with no acute distress.  EYES: Pupils equal, round, reactive to light and accommodation. No scleral icterus. Extraocular muscles intact.  HEENT: Head atraumatic, normocephalic. Oropharynx and nasopharynx clear.  NECK:  Supple, no jugular venous distention. No thyroid enlargement, no tenderness.  LUNGS: Normal breath sounds bilaterally, positive for wheezing , no crepitation. No use of accessory muscles  of respiration. On nasal canula oxygen. CARDIOVASCULAR: S1, S2 normal. No murmurs, rubs, or gallops.  ABDOMEN: Soft, nontender, nondistended. Bowel sounds present. No organomegaly or mass.  EXTREMITIES: No pedal edema, cyanosis, or clubbing.  NEUROLOGIC: Patient is alert, moves all 4 limbs, follows commands power 5/5. PSYCHIATRIC: The patient is alert and oriented.  SKIN: No obvious rash, lesion, or ulcer.   DATA REVIEW:   CBC  Recent Labs Lab 10/20/16 0714  WBC 14.1*  HGB 13.9  HCT 43.2  PLT 270    Chemistries   Recent Labs Lab 10/19/16 0319  10/21/16 1607  NA 141  < > 138  K 4.2  < > 4.2  CL 89*  < > 99*  CO2 41*  < > 32  GLUCOSE 163*  < > 225*  BUN 23*  < > 35*  CREATININE 0.91  < > 0.90  CALCIUM 8.3*  < > 9.0  MG 2.3  --   --   < > = values in this interval not displayed.  Cardiac Enzymes No results for input(s): TROPONINI in the last 168 hours.  Microbiology Results  Results for orders placed or performed during the hospital encounter of 10/16/16  Culture, blood (Routine X 2) w Reflex to ID Panel     Status: None   Collection Time: 10/17/16 12:00 AM  Result Value Ref Range  Status   Specimen Description BLOOD LEFT ANTECUBITAL  Final   Special Requests   Final    BOTTLES DRAWN AEROBIC AND ANAEROBIC Blood Culture adequate volume   Culture NO GROWTH 5 DAYS  Final   Report Status 10/22/2016 FINAL  Final  MRSA PCR Screening     Status: None   Collection Time: 10/17/16 12:12 AM  Result Value Ref Range Status   MRSA by PCR NEGATIVE NEGATIVE Final    Comment:        The GeneXpert MRSA Assay (FDA approved for NASAL specimens only), is one component of a comprehensive MRSA colonization surveillance program. It is not intended to diagnose MRSA infection nor to guide or monitor treatment for MRSA infections.   Culture, blood (Routine X 2) w Reflex to ID Panel     Status: None   Collection Time: 10/17/16 12:15 AM  Result Value Ref Range Status   Specimen Description BLOOD LEFT HAND  Final   Special Requests   Final    BOTTLES DRAWN AEROBIC AND ANAEROBIC Blood Culture adequate volume   Culture NO GROWTH 5 DAYS  Final   Report Status 10/22/2016 FINAL  Final  Culture, respiratory (NON-Expectorated)     Status: None   Collection Time: 10/17/16  7:30 AM  Result Value Ref Range Status   Specimen Description TRACHEAL ASPIRATE  Final   Special Requests NONE  Final   Gram Stain   Final    MODERATE WBC PRESENT,BOTH PMN AND MONONUCLEAR RARE SQUAMOUS EPITHELIAL CELLS PRESENT RARE GRAM POSITIVE COCCI IN PAIRS IN CLUSTERS RARE GRAM POSITIVE RODS    Culture   Final    Consistent with normal respiratory flora. Performed at [facility], [address], [place], [place] [address]    Report Status 10/19/2016 FINAL  Final    RADIOLOGY:  No results found.  EKG:   Orders placed or performed during the hospital encounter of 10/16/16  . ED EKG within 10 minutes  . ED EKG within 10 minutes      Management plans discussed with the patient, family and they are in agreement.  CODE STATUS:  Code Status History    Date Active Date Inactive Code Status Order  ID Comments User Context   10/16/2016  9:16 PM 10/22/2016  8:01 PM Full Code [id number]  Marguarite ArbourSparks, Jeffrey D, MD Inpatient   10/09/2015 11:57 PM 10/10/2015  5:20 PM Full Code [id number]  Oralia ManisWillis, David, MD Inpatient   09/26/2015  4:41 PM 09/28/2015  5:52 PM Full Code [id number]  Katharina CaperVaickute, Rima, MD Inpatient   09/02/2015  1:02 AM 09/04/2015  8:24 PM Full Code [id number]  Oralia ManisWillis, David, MD Inpatient      TOTAL TIME TAKING CARE OF THIS PATIENT: *35 minutes.    Altamese DillingVACHHANI, Abbee Cremeens M.D on 10/25/2016 at 7:46 AM  Between 7am to 6pm - Pager - [phone]  After 6pm go to [email or web address] - password [email or web address] [facility]  [facility]  Office  [phone]  CC: Primary care physician; Abran RichardBaucom, Jenny B, PA-C   Note: This dictation was prepared with Dragon dictation along with smaller phrase technology. Any transcriptional errors that result from this process are unintentional.

## 2016-10-28 ENCOUNTER — Other Ambulatory Visit: Payer: Self-pay | Admitting: Internal Medicine

## 2016-10-28 DIAGNOSIS — G4733 Obstructive sleep apnea (adult) (pediatric): Secondary | ICD-10-CM

## 2016-10-28 DIAGNOSIS — I5033 Acute on chronic diastolic (congestive) heart failure: Secondary | ICD-10-CM

## 2016-10-28 NOTE — Progress Notes (Signed)
Order for Cpap enter under instruction of Dr. Nicholos Johnsamachandran based on sleep study 10/21/2015. Not able to order sleep study per Medicaid guidelines Patient will need new sleep study and office visit within 1 year.

## 2016-11-01 ENCOUNTER — Ambulatory Visit: Payer: Medicaid Other | Attending: Family | Admitting: Family

## 2016-11-01 ENCOUNTER — Encounter: Payer: Self-pay | Admitting: Family

## 2016-11-01 VITALS — BP 138/75 | HR 76 | Resp 20 | Wt 260.4 lb

## 2016-11-01 DIAGNOSIS — M179 Osteoarthritis of knee, unspecified: Secondary | ICD-10-CM | POA: Diagnosis not present

## 2016-11-01 DIAGNOSIS — Z820 Family history of epilepsy and other diseases of the nervous system: Secondary | ICD-10-CM | POA: Diagnosis not present

## 2016-11-01 DIAGNOSIS — Z96652 Presence of left artificial knee joint: Secondary | ICD-10-CM | POA: Diagnosis not present

## 2016-11-01 DIAGNOSIS — G4733 Obstructive sleep apnea (adult) (pediatric): Secondary | ICD-10-CM

## 2016-11-01 DIAGNOSIS — Z955 Presence of coronary angioplasty implant and graft: Secondary | ICD-10-CM | POA: Insufficient documentation

## 2016-11-01 DIAGNOSIS — M109 Gout, unspecified: Secondary | ICD-10-CM | POA: Insufficient documentation

## 2016-11-01 DIAGNOSIS — E782 Mixed hyperlipidemia: Secondary | ICD-10-CM | POA: Insufficient documentation

## 2016-11-01 DIAGNOSIS — F17210 Nicotine dependence, cigarettes, uncomplicated: Secondary | ICD-10-CM | POA: Diagnosis not present

## 2016-11-01 DIAGNOSIS — H409 Unspecified glaucoma: Secondary | ICD-10-CM | POA: Diagnosis not present

## 2016-11-01 DIAGNOSIS — Z8249 Family history of ischemic heart disease and other diseases of the circulatory system: Secondary | ICD-10-CM | POA: Diagnosis not present

## 2016-11-01 DIAGNOSIS — E669 Obesity, unspecified: Secondary | ICD-10-CM | POA: Insufficient documentation

## 2016-11-01 DIAGNOSIS — I5032 Chronic diastolic (congestive) heart failure: Secondary | ICD-10-CM

## 2016-11-01 DIAGNOSIS — Z86010 Personal history of colonic polyps: Secondary | ICD-10-CM | POA: Diagnosis not present

## 2016-11-01 DIAGNOSIS — J449 Chronic obstructive pulmonary disease, unspecified: Secondary | ICD-10-CM | POA: Diagnosis not present

## 2016-11-01 DIAGNOSIS — I110 Hypertensive heart disease with heart failure: Secondary | ICD-10-CM | POA: Insufficient documentation

## 2016-11-01 DIAGNOSIS — I272 Pulmonary hypertension, unspecified: Secondary | ICD-10-CM | POA: Insufficient documentation

## 2016-11-01 DIAGNOSIS — Z7982 Long term (current) use of aspirin: Secondary | ICD-10-CM | POA: Diagnosis not present

## 2016-11-01 DIAGNOSIS — Z981 Arthrodesis status: Secondary | ICD-10-CM | POA: Insufficient documentation

## 2016-11-01 DIAGNOSIS — I10 Essential (primary) hypertension: Secondary | ICD-10-CM

## 2016-11-01 DIAGNOSIS — Z720 Tobacco use: Secondary | ICD-10-CM

## 2016-11-01 DIAGNOSIS — Z9981 Dependence on supplemental oxygen: Secondary | ICD-10-CM | POA: Insufficient documentation

## 2016-11-01 DIAGNOSIS — Z8261 Family history of arthritis: Secondary | ICD-10-CM | POA: Insufficient documentation

## 2016-11-01 DIAGNOSIS — J410 Simple chronic bronchitis: Secondary | ICD-10-CM

## 2016-11-01 MED ORDER — ALLOPURINOL 300 MG PO TABS: 300.0000 mg | ORAL_TABLET | Freq: Every day | ORAL | 5 refills | Status: AC

## 2016-11-01 MED ORDER — ATORVASTATIN CALCIUM 40 MG PO TABS: 40.0000 mg | ORAL_TABLET | Freq: Every day | ORAL | 5 refills | Status: AC

## 2016-11-01 MED ORDER — TIOTROPIUM BROMIDE MONOHYDRATE 18 MCG IN CAPS: 18.0000 ug | ORAL_CAPSULE | Freq: Every day | RESPIRATORY_TRACT | 5 refills | Status: DC

## 2016-11-01 MED ORDER — MOMETASONE FURO-FORMOTEROL FUM 100-5 MCG/ACT IN AERO
2.0000 | INHALATION_SPRAY | Freq: Two times a day (BID) | RESPIRATORY_TRACT | 5 refills | Status: DC
Start: 2016-11-01 — End: 2016-12-21

## 2016-11-01 NOTE — Patient Instructions (Addendum)
Begin weighing daily and call for an overnight weight gain of > 2 pounds or a weekly weight gain of >5 pounds.  Double furosemide (lasix) to 2 tablets twice daily for the next 3 days. Also increase your potassium to 2 tablets daily for the next 3 days. After 3 days, resume your regular doses.  Maintain fluid intake to 60 ounces daily.  No more than 2000mg  sodium daily.   Ask primary care provider about treatment for your right knee pain.   Follow up with Dr. Juliann Pares  November 04, 2016 @ 11:15 AM    Steps to Quit Smoking Smoking tobacco can be bad for your health. It can also affect almost every organ in your body. Smoking puts you and people around you at risk for many serious long-lasting (chronic) diseases. Quitting smoking is hard, but it is one of the best things that you can do for your health. It is never too late to quit. What are the benefits of quitting smoking? When you quit smoking, you lower your risk for getting serious diseases and conditions. They can include:  Lung cancer or lung disease.  Heart disease.  Stroke.  Heart attack.  Not being able to have children (infertility).  Weak bones (osteoporosis) and broken bones (fractures).  If you have coughing, wheezing, and shortness of breath, those symptoms may get better when you quit. You may also get sick less often. If you are pregnant, quitting smoking can help to lower your chances of having a baby of low birth weight. What can I do to help me quit smoking? Talk with your doctor about what can help you quit smoking. Some things you can do (strategies) include:  Quitting smoking totally, instead of slowly cutting back how much you smoke over a period of time.  Going to in-person counseling. You are more likely to quit if you go to many counseling sessions.  Using resources and support systems, such as: ? Agricultural engineer with a Veterinary surgeon. ? Phone quitlines. ? Automotive engineer. ? Support groups or group  counseling. ? Text messaging programs. ? Mobile phone apps or applications.  Taking medicines. Some of these medicines may have nicotine in them. If you are pregnant or breastfeeding, do not take any medicines to quit smoking unless your doctor says it is okay. Talk with your doctor about counseling or other things that can help you.  Talk with your doctor about using more than one strategy at the same time, such as taking medicines while you are also going to in-person counseling. This can help make quitting easier. What things can I do to make it easier to quit? Quitting smoking might feel very hard at first, but there is a lot that you can do to make it easier. Take these steps:  Talk to your family and friends. Ask them to support and encourage you.  Call phone quitlines, reach out to support groups, or work with a Veterinary surgeon.  Ask people who smoke to not smoke around you.  Avoid places that make you want (trigger) to smoke, such as: ? Bars. ? Parties. ? Smoke-break areas at work.  Spend time with people who do not smoke.  Lower the stress in your life. Stress can make you want to smoke. Try these things to help your stress: ? Getting regular exercise. ? Deep-breathing exercises. ? Yoga. ? Meditating. ? Doing a body scan. To do this, close your eyes, focus on one area of your body at a time from  head to toe, and notice which parts of your body are tense. Try to relax the muscles in those areas.  Download or buy apps on your mobile phone or tablet that can help you stick to your quit plan. There are many free apps, such as QuitGuide from the [employer] developer(Centers for Disease Control and Prevention). You can find more support from smokefree.gov and other websites.  This information is not intended to replace advice given to you by your health care provider. Make sure you discuss any questions you have with your health care provider. Document Released: 01/09/2009 Document Revised: 11/11/2015  Document Reviewed: 07/30/2014 Elsevier Interactive Patient Education  2018 [employer]

## 2016-11-01 NOTE — Progress Notes (Signed)
Patient ID: Aaron Doyle, male    DOB: 22-Oct-1955, 61 y.o.   MRN: [id number]  HPI  Aaron Doyle is a 61 y/o male with a history of gout, glaucoma, HTN, hyperlipidemia, obstructive sleep apnea (CPAP), COPD (chronic oxygen), osteoarthritis, current tobacco use and chronic heart failure.   Echo report on 10/17/16 reviewed and shows an EF of 60-65% along with trivial Aaron/TR. Cardiac catheterization done 06/24/15 showed an EF of >55%, normal coronaries and mild pulmonary HTN with a PA pressure of 32 mm Hg.  Admitted 10/16/16 with COPD/HF exacerbation. Initially needed intubation and IV diuretics. Was given antibiotics and solu-medrol. Recommended resuming CPAP. Cardiology consult obtained and discharged home after 6 days.   He presents today for his initial visit with a chief complaint of moderate fatigue with minimal exertion. He describes this as chronic in nature having been present for several years with varying levels of severity. He has associated shortness of breath and edema along with this. Sleeping well with CPAP and oxygen.   Past Medical History:  Diagnosis Date  . Bilateral lower extremity edema   . CHF (congestive heart failure) (HCC)   . Chronic gout   . Colon polyp   . COPD (chronic obstructive pulmonary disease) (HCC)   . Glaucoma   . Heart murmur   . Hypertension   . Mild pulmonary hypertension (HCC)   . Mixed hyperlipidemia   . Nonrheumatic mitral valve disorder   . Obesity   . Obstructive sleep apnea   . Osteoarthritis of knee   . Shortness of breath   . Smoking     Past Surgical History:  Procedure Laterality Date  . CARDIAC CATHETERIZATION N/A 06/24/2015   Procedure: Right/Left Heart Cath and Coronary Angiography;  Surgeon: Alwyn Pea, MD;  Location: [facility] INVASIVE CV LAB;  Service: Cardiovascular;  Laterality: N/A;  . CERVICAL FUSION    . REPLACEMENT TOTAL KNEE Left ~2014   Family History  Problem Relation Age of Onset  . Arthritis Unknown   . Heart attack  Unknown   . Multiple sclerosis Unknown    Social History  Substance Use Topics  . Smoking status: Current Every Day Smoker    Packs/day: 0.25    Years: 40.00    Types: Cigarettes  . Smokeless tobacco: Never Used  . Alcohol use 1.8 oz/week    3 Shots of liquor per week   No Known Allergies Prior to Admission medications   Medication Sig Start Date End Date Taking? Authorizing Provider  albuterol (PROVENTIL) (2.5 MG/3ML) 0.083% nebulizer solution Take 3 mLs (2.5 mg total) by nebulization every 6 (six) hours as needed for wheezing or shortness of breath. 10/22/16  Yes Altamese Dilling, MD  aspirin EC 81 MG tablet Take 81 mg by mouth daily.   Yes [provider]  carvedilol (COREG) 3.125 MG tablet Take 1 tablet (3.125 mg total) by mouth 2 (two) times daily with a meal. 10/22/16  Yes Altamese Dilling, MD  furosemide (LASIX) 20 MG tablet Take 1 tablet (20 mg total) by mouth 2 (two) times daily. 10/22/16 10/22/17 Yes Altamese Dilling, MD  potassium chloride SA (K-DUR,KLOR-CON) 20 MEQ tablet Take 1 tablet (20 mEq total) by mouth daily. 10/22/16  Yes Altamese Dilling, MD  allopurinol (ZYLOPRIM) 300 MG tablet Take 300 mg by mouth at bedtime.    [provider]  atorvastatin (LIPITOR) 40 MG tablet Take 40 mg by mouth daily.    [provider]  mometasone-formoterol (DULERA) 100-5 MCG/ACT AERO Inhale  2 puffs into the lungs 2 (two) times daily. Patient not taking: Reported on 11/01/2016 10/22/16   Altamese DillingVachhani, Vaibhavkumar, MD  tiotropium (SPIRIVA) 18 MCG inhalation capsule Place 1 capsule (18 mcg total) into inhaler and inhale daily. Patient not taking: Reported on 11/01/2016 09/04/15   Hower, Cletis Athensavid K, MD    Review of Systems  Constitutional: Positive for fatigue. Negative for appetite change.  HENT: Negative for congestion, rhinorrhea and sore throat.   Eyes: Negative.   Respiratory: Positive for shortness of breath. Negative for cough and chest tightness.    Cardiovascular: Positive for leg swelling. Negative for chest pain and palpitations.  Gastrointestinal: Negative for abdominal distention and abdominal pain.  Endocrine: Negative.   Genitourinary: Negative.   Musculoskeletal: Positive for arthralgias (right knee). Negative for back pain.  Skin: Negative.   Allergic/Immunologic: Negative.   Neurological: Negative for dizziness and light-headedness.  Hematological: Negative for adenopathy. Does not bruise/bleed easily.  Psychiatric/Behavioral: Negative for dysphoric mood and sleep disturbance (wearing oxygen and CPAP at night). The patient is not nervous/anxious.    Vitals:   11/01/16 0850  BP: 138/75  Pulse: 76  Resp: 20  SpO2: 99%  Weight: 260 lb 6 oz (118.1 kg)   Wt Readings from Last 3 Encounters:  11/01/16 260 lb 6 oz (118.1 kg)  10/22/16 246 lb 11.2 oz (111.9 kg)  10/10/15 276 lb 9.6 oz (125.5 kg)   Lab Results  Component Value Date   CREATININE 0.90 10/21/2016   CREATININE 0.99 10/20/2016   CREATININE 0.91 10/19/2016    Physical Exam  Constitutional: He is oriented to person, place, and time. He appears well-developed and well-nourished.  HENT:  Head: Normocephalic and atraumatic.  Neck: Normal range of motion. Neck supple. No JVD present.  Cardiovascular: Normal rate and regular rhythm.   Pulmonary/Chest: Effort normal. He has no wheezes. He has no rales.  Abdominal: Soft. He exhibits no distension. There is no tenderness.  Musculoskeletal: He exhibits edema (2+ pitting edema in bilateral lower legs with L>R). He exhibits no tenderness.  Neurological: He is alert and oriented to person, place, and time.  Skin: Skin is warm and dry.  Psychiatric: He has a normal mood and affect. His behavior is normal. Thought content normal.  Nursing note and vitals reviewed.    Assessment & Plan:  1: Chronic heart failure with preserved ejection fraction- - NYHA class III - moderately fluid overloaded today - hasn't been  weighing daily and is going to get a scale today. Instructed to begin weighing daily and to call for an overnight weight gain of >2 pounds or a weekly weight gain of >5 pounds - wearing TEDS hose and says that first thing in the morning, his leg swelling is completely gone - not adding salt but hasn't been reading food labels. Discussed the importance of closely following a 2000mg  sodium diet and written dietary information was given to him about this - has been drinking "lots" of fluid but isn't sure of exact amount. Discussed the importance of closely monitoring fluid intake to keep daily fluid intake to around 60 ounces daily - will increase his furosemide to 40mg  BID along with increasing potassium to 40meq daily for the next 3 days and then resume original dose of both - PharmD went in and reviewed medications with the patient - saw cardiologist Juliann Pares([place]) 10/16/15 and an appointment was made with him for 11/04/16  2: HTN- - BP looks good today - patient has to call PCP to make an  appointment  3: Obstructive sleep apnea- - wearing CPAP and adjusting well. Wore it 6 hours last night  4: COPD - has his medicaid now so can get his inhalers picked up and started - sees pulmonologist Nicholos Johns) 11/22/16  5: Tobacco use-  - continues to smoke 1/4ppd of cigarettes - complete cessation discussed with the patient  Return in 1 week or sooner for any questions/problems before then. Will plan on checking lab work next week.

## 2016-11-09 ENCOUNTER — Ambulatory Visit: Payer: Medicaid Other | Admitting: Family

## 2016-11-15 ENCOUNTER — Encounter: Payer: Self-pay | Admitting: Family

## 2016-11-15 ENCOUNTER — Telehealth: Payer: Self-pay | Admitting: Family

## 2016-11-15 ENCOUNTER — Ambulatory Visit: Payer: Medicaid Other | Attending: Family | Admitting: Family

## 2016-11-15 VITALS — BP 145/71 | HR 68 | Resp 18 | Ht 72.0 in | Wt 261.4 lb

## 2016-11-15 DIAGNOSIS — Z9889 Other specified postprocedural states: Secondary | ICD-10-CM | POA: Diagnosis not present

## 2016-11-15 DIAGNOSIS — G4733 Obstructive sleep apnea (adult) (pediatric): Secondary | ICD-10-CM | POA: Diagnosis not present

## 2016-11-15 DIAGNOSIS — F17210 Nicotine dependence, cigarettes, uncomplicated: Secondary | ICD-10-CM | POA: Diagnosis not present

## 2016-11-15 DIAGNOSIS — J449 Chronic obstructive pulmonary disease, unspecified: Secondary | ICD-10-CM | POA: Diagnosis not present

## 2016-11-15 DIAGNOSIS — I110 Hypertensive heart disease with heart failure: Secondary | ICD-10-CM | POA: Diagnosis present

## 2016-11-15 DIAGNOSIS — I509 Heart failure, unspecified: Secondary | ICD-10-CM | POA: Insufficient documentation

## 2016-11-15 DIAGNOSIS — Z79899 Other long term (current) drug therapy: Secondary | ICD-10-CM | POA: Insufficient documentation

## 2016-11-15 DIAGNOSIS — I5032 Chronic diastolic (congestive) heart failure: Secondary | ICD-10-CM

## 2016-11-15 DIAGNOSIS — Z720 Tobacco use: Secondary | ICD-10-CM

## 2016-11-15 DIAGNOSIS — J410 Simple chronic bronchitis: Secondary | ICD-10-CM

## 2016-11-15 DIAGNOSIS — I10 Essential (primary) hypertension: Secondary | ICD-10-CM

## 2016-11-15 LAB — BASIC METABOLIC PANEL
Anion gap: 12 (ref 5–15)
BUN: 9 mg/dL (ref 6–20)
CALCIUM: 8.7 mg/dL — AB (ref 8.9–10.3)
CO2: 36 mmol/L — ABNORMAL HIGH (ref 22–32)
CREATININE: 0.86 mg/dL (ref 0.61–1.24)
Chloride: 94 mmol/L — ABNORMAL LOW (ref 101–111)
GFR calc Af Amer: >60 mL/min (ref >60)
GLUCOSE: 175 mg/dL — AB (ref 65–99)
Potassium: 3.2 mmol/L — ABNORMAL LOW (ref 3.5–5.1)
SODIUM: 142 mmol/L (ref 135–145)

## 2016-11-15 MED ORDER — POTASSIUM CHLORIDE CRYS ER 20 MEQ PO TBCR: 20.0000 meq | EXTENDED_RELEASE_TABLET | Freq: Two times a day (BID) | ORAL | 3 refills | Status: DC

## 2016-11-15 MED ORDER — FUROSEMIDE 40 MG PO TABS: 40.0000 mg | ORAL_TABLET | Freq: Two times a day (BID) | ORAL | 3 refills | Status: AC

## 2016-11-15 NOTE — Progress Notes (Signed)
Patient ID: Aaron Doyle, male    DOB: 30-Jun-1955, 61 y.o.   MRN: [id number]  HPI  Aaron Doyle is a 61 y/o male with a history of gout, glaucoma, HTN, hyperlipidemia, obstructive sleep apnea (CPAP), COPD (chronic oxygen), osteoarthritis, current tobacco use and chronic heart failure.   Echo report on 10/17/16 reviewed and shows an EF of 60-65% along with trivial Aaron/TR. Cardiac catheterization done 06/24/15 showed an EF of >55%, normal coronaries and mild pulmonary HTN with a PA pressure of 32 mm Hg.  Admitted 10/16/16 with COPD/HF exacerbation. Initially needed intubation and IV diuretics. Was given antibiotics and solu-medrol. Recommended resuming CPAP. Cardiology consult obtained and discharged home after 6 days.   He presents today for his follow-up visit with a chief complaint of mild shortness of breath with minimal exertion. He describes this as chronic in nature having been present for several years with varying levels of severity. Does feel like his breathing was better for those 3 days that he took a higher dose of diuretic but now feels like it's worsening again. Definitely worse with the humid weather. Has associated fatigue, edema and weight gain along with this. Denies any cough, chest pain, dizziness or difficulty sleeping.   Past Medical History:  Diagnosis Date  . Bilateral lower extremity edema   . CHF (congestive heart failure) (HCC)   . Chronic gout   . Colon polyp   . COPD (chronic obstructive pulmonary disease) (HCC)   . Glaucoma   . Heart murmur   . Hypertension   . Mild pulmonary hypertension (HCC)   . Mixed hyperlipidemia   . Nonrheumatic mitral valve disorder   . Obesity   . Obstructive sleep apnea   . Osteoarthritis of knee   . Shortness of breath   . Smoking     Past Surgical History:  Procedure Laterality Date  . CARDIAC CATHETERIZATION N/A 06/24/2015   Procedure: Right/Left Heart Cath and Coronary Angiography;  Surgeon: Alwyn Pea, MD;  Location:  [facility] INVASIVE CV LAB;  Service: Cardiovascular;  Laterality: N/A;  . CERVICAL FUSION    . REPLACEMENT TOTAL KNEE Left ~2014   Family History  Problem Relation Age of Onset  . Arthritis Unknown   . Heart attack Unknown   . Multiple sclerosis Unknown    Social History  Substance Use Topics  . Smoking status: Current Every Day Smoker    Packs/day: 0.25    Years: 40.00    Types: Cigarettes  . Smokeless tobacco: Never Used  . Alcohol use 1.8 oz/week    3 Shots of liquor per week   No Known Allergies  Prior to Admission medications   Medication Sig Start Date End Date Taking? Authorizing Provider  albuterol (PROVENTIL) (2.5 MG/3ML) 0.083% nebulizer solution Take 3 mLs (2.5 mg total) by nebulization every 6 (six) hours as needed for wheezing or shortness of breath. 10/22/16  Yes Altamese Dilling, MD  allopurinol (ZYLOPRIM) 300 MG tablet Take 1 tablet (300 mg total) by mouth at bedtime. 11/01/16  Yes Clarisa Kindred A, FNP  aspirin EC 81 MG tablet Take 81 mg by mouth daily.   Yes [provider]  atorvastatin (LIPITOR) 40 MG tablet Take 1 tablet (40 mg total) by mouth daily. 11/01/16  Yes Clarisa Kindred A, FNP  carvedilol (COREG) 3.125 MG tablet Take 1 tablet (3.125 mg total) by mouth 2 (two) times daily with a meal. 10/22/16  Yes Altamese Dilling, MD  furosemide (LASIX) 20 MG tablet Take 1 tablet (  20 mg total) by mouth 2 (two) times daily. 10/22/16 10/22/17 Yes Altamese Dilling, MD  potassium chloride SA (K-DUR,KLOR-CON) 20 MEQ tablet Take 1 tablet (20 mEq total) by mouth daily. 10/22/16  Yes Altamese Dilling, MD  mometasone-formoterol (DULERA) 100-5 MCG/ACT AERO Inhale 2 puffs into the lungs 2 (two) times daily. Patient not taking: Reported on 11/15/2016 11/01/16   Delma Freeze, FNP  tiotropium (SPIRIVA) 18 MCG inhalation capsule Place 1 capsule (18 mcg total) into inhaler and inhale daily. Patient not taking: Reported on 11/15/2016 11/01/16   Delma Freeze, FNP      Review of Systems  Constitutional: Positive for fatigue. Negative for appetite change.  HENT: Negative for congestion, rhinorrhea and sore throat.   Eyes: Negative.   Respiratory: Positive for shortness of breath. Negative for cough, chest tightness and wheezing.   Cardiovascular: Positive for leg swelling. Negative for chest pain and palpitations.  Gastrointestinal: Negative for abdominal distention and abdominal pain.  Endocrine: Negative.   Genitourinary: Negative.   Musculoskeletal: Positive for arthralgias (right knee pain improving). Negative for back pain.  Skin: Negative.   Allergic/Immunologic: Negative.   Neurological: Negative for dizziness and light-headedness.  Hematological: Negative for adenopathy. Does not bruise/bleed easily.  Psychiatric/Behavioral: Negative for dysphoric mood and sleep disturbance (wearing oxygen and CPAP at night). The patient is not nervous/anxious.    Vitals:   11/15/16 0845  BP: (!) 145/71  Pulse: 68  Resp: 18  SpO2: 96%  Weight: 261 lb 6 oz (118.6 kg)  Height: 6' (1.829 m)   Wt Readings from Last 3 Encounters:  11/15/16 261 lb 6 oz (118.6 kg)  11/01/16 260 lb 6 oz (118.1 kg)  10/22/16 246 lb 11.2 oz (111.9 kg)    Lab Results  Component Value Date   CREATININE 0.90 10/21/2016   CREATININE 0.99 10/20/2016   CREATININE 0.91 10/19/2016    Physical Exam  Constitutional: He is oriented to person, place, and time. He appears well-developed and well-nourished.  HENT:  Head: Normocephalic and atraumatic.  Neck: Normal range of motion. Neck supple. No JVD present.  Cardiovascular: Normal rate and regular rhythm.   Pulmonary/Chest: Effort normal. He has no wheezes. He has no rales.  Abdominal: Soft. He exhibits no distension. There is no tenderness.  Musculoskeletal: He exhibits edema (2+ pitting edema in bilateral lower legs with L>R). He exhibits no tenderness.  Neurological: He is alert and oriented to person, place, and time.   Skin: Skin is warm and dry.  Psychiatric: He has a normal mood and affect. His behavior is normal. Thought content normal.  Nursing note and vitals reviewed.    Assessment & Plan:  1: Chronic heart failure with preserved ejection fraction- - NYHA class III - moderately fluid overloaded again today - has been weighing daily and says that his weight is starting to increase again. Reminded to call for an overnight weight gain of >2 pounds or a weekly weight gain of >5 pounds - wearing TEDS hose and says that first thing in the morning, his leg swelling is completely gone - adding "some" salt but says that he's doing better. Trying to read food labels more.  - estimates that he's drinking ~ 64 ounces of fluid daily. Encouraged him to decrease that to closer to 60 ounces daily.  - will increase his furosemide to 40mg  BID and potassium BID - check a BMP today - saw cardiologist Juliann Pares) 10/16/15 and is seeing him later today  2: HTN- - BP looks  good today - patient has to call PCP to make an appointment  3: Obstructive sleep apnea- - wearing CPAP and adjusting well. Wore it 8 hours last night - wearing oxygent at 2.5L around the clock  4: COPD - still waiting on medicaid - sees pulmonologist Nicholos Johns) 11/22/16 - unable to get his inhalers because he currently doesn't have insurance; pulmonology office called but says that they don't give out samples anymore  5: Tobacco use-  - continues to smoke 1/4ppd of cigarettes - complete cessation discussed with the patient  Return in 2 weeks or sooner for any questions/problems before then.

## 2016-11-15 NOTE — Patient Instructions (Signed)
Continue weighing daily and call for an overnight weight gain of > 2 pounds or a weekly weight gain of >5 pounds. 

## 2016-11-15 NOTE — Telephone Encounter (Signed)
LM on patient's voicemail regarding labs that were drawn on 11/15/16. Potassium level is low at 3.2. Will increase potassium to TID and continue furosemide 40mg  BID. Will recheck labs at his next visit in 2 weeks.   Patient to call back if he has any further questions.

## 2016-11-16 NOTE — Progress Notes (Deleted)
[facility]monary Medicine      Assessment and Plan:     Date: 11/16/2016  MRN# [id number] Aaron Doyle [birth year]    Aaron Doyle is a 61 y.o. old male seen in consultation for chief complaint of:   No chief complaint on file.   HPI:   Aaron Doyle is a 61 y/o male with a history of gout, glaucoma, HTN, hyperlipidemia, obstructive sleep apnea (CPAP), COPD (chronic oxygen), osteoarthritis, current tobacco use and chronic heart failure. He was recently admitted to the hospital in July 2018 with  COPD and heart failure exacerbation, for which he required intubation.  **ABG, 10/17/2016; 7.49/64/69/21.5; consistent with chronic hypercapnic respiratory failure. **Echo report on 10/17/16 reviewed and shows an EF of 60-65% along with trivial Aaron/TR. Cardiac catheterization done 06/24/15 showed an EF of >55%, normal coronaries and mild pulmonary HTN with a PA pressure of 32 mm Hg.  Medication:    Current Outpatient Prescriptions:  .  albuterol (PROVENTIL) (2.5 MG/3ML) 0.083% nebulizer solution, Take 3 mLs (2.5 mg total) by nebulization every 6 (six) hours as needed for wheezing or shortness of breath., Disp: 75 mL, Rfl: 2 .  allopurinol (ZYLOPRIM) 300 MG tablet, Take 1 tablet (300 mg total) by mouth at bedtime., Disp: 30 tablet, Rfl: 5 .  aspirin EC 81 MG tablet, Take 81 mg by mouth daily., Disp: , Rfl:  .  atorvastatin (LIPITOR) 40 MG tablet, Take 1 tablet (40 mg total) by mouth daily., Disp: 30 tablet, Rfl: 5 .  carvedilol (COREG) 3.125 MG tablet, Take 1 tablet (3.125 mg total) by mouth 2 (two) times daily with a meal., Disp: 60 tablet, Rfl: 0 .  furosemide (LASIX) 40 MG tablet, Take 1 tablet (40 mg total) by mouth 2 (two) times daily., Disp: 180 tablet, Rfl: 3 .  mometasone-formoterol (DULERA) 100-5 MCG/ACT AERO, Inhale 2 puffs into the lungs 2 (two) times daily. (Patient not taking: Reported on 11/15/2016), Disp: 1 Inhaler, Rfl: 5 .  potassium chloride SA (K-DUR,KLOR-CON) 20 MEQ  tablet, Take 1 tablet (20 mEq total) by mouth 2 (two) times daily., Disp: 90 tablet, Rfl: 3 .  tiotropium (SPIRIVA) 18 MCG inhalation capsule, Place 1 capsule (18 mcg total) into inhaler and inhale daily. (Patient not taking: Reported on 11/15/2016), Disp: 30 capsule, Rfl: 5   Allergies:  Patient has no known allergies.  Review of Systems: Gen:  Denies  fever, sweats, chills HEENT: Denies blurred vision, double vision. bleeds, sore throat Cvc:  No dizziness, chest pain. Resp:   Denies cough or sputum production, shortness of breath Gi: Denies swallowing difficulty, stomach pain. Gu:  Denies bladder incontinence, burning urine Ext:   No Joint pain, stiffness. Skin: No skin rash,  hives  Endoc:  No polyuria, polydipsia. Psych: No depression, insomnia. Other:  All other systems were reviewed with the patient and were negative other that what is mentioned in the HPI.   Physical Examination:   VS: There were no vitals taken for this visit.  General Appearance: No distress  Neuro:without focal findings,  speech normal,  HEENT: PERRLA, EOM intact.   Pulmonary: normal breath sounds, No wheezing.  CardiovascularNormal S1,S2.  No m/r/g.   Abdomen: Benign, Soft, non-tender. Renal:  No costovertebral tenderness  GU:  No performed at this time. Endoc: No evident thyromegaly, no signs of acromegaly. Skin:   warm, no rashes, no ecchymosis  Extremities: normal, no cyanosis, clubbing.  Other findings:    LABORATORY PANEL:   CBC No results for input(s):  WBC, HGB, HCT, PLT in the last 168 hours. ------------------------------------------------------------------------------------------------------------------  Chemistries   Recent Labs Lab 11/15/16 0914  NA 142  K 3.2*  CL 94*  CO2 36*  GLUCOSE 175*  BUN 9  CREATININE 0.86  CALCIUM 8.7*   ------------------------------------------------------------------------------------------------------------------  Cardiac Enzymes No  results for input(s): TROPONINI in the last 168 hours. ------------------------------------------------------------  RADIOLOGY:  No results found.     Thank  you for the consultation and for allowing [facility], [facility] Care to assist in the care of your patient. Our recommendations are noted above.  Please contact us if we can be of further service.   Wells Guiles, MD.  Board Certified in Internal Medicine, Pulmonary Medicine, Critical Care Medicine, and Sleep Medicine.   Pulmonary and Critical Care Office Number: [phone]  Santiago Glad, M.D.  Billy Fischer, M.D  11/16/2016

## 2016-11-22 ENCOUNTER — Inpatient Hospital Stay: Payer: Medicaid Other | Admitting: Internal Medicine

## 2016-11-30 ENCOUNTER — Ambulatory Visit: Payer: Medicaid Other | Admitting: Family

## 2016-11-30 ENCOUNTER — Telehealth: Payer: Self-pay | Admitting: Family

## 2016-11-30 NOTE — Telephone Encounter (Signed)
Patient did not show for his Heart Failure Clinic appointment on 11/30/16. Will attempt to reschedule.  

## 2016-12-21 ENCOUNTER — Encounter: Payer: Self-pay | Admitting: Internal Medicine

## 2016-12-21 ENCOUNTER — Ambulatory Visit (INDEPENDENT_AMBULATORY_CARE_PROVIDER_SITE_OTHER): Payer: Medicaid Other | Admitting: Internal Medicine

## 2016-12-21 VITALS — BP 172/92 | HR 101 | Resp 16 | Ht 72.0 in | Wt 260.0 lb

## 2016-12-21 DIAGNOSIS — J449 Chronic obstructive pulmonary disease, unspecified: Secondary | ICD-10-CM

## 2016-12-21 DIAGNOSIS — J4489 Other specified chronic obstructive pulmonary disease: Secondary | ICD-10-CM

## 2016-12-21 DIAGNOSIS — G4733 Obstructive sleep apnea (adult) (pediatric): Secondary | ICD-10-CM | POA: Diagnosis not present

## 2016-12-21 MED ORDER — MOMETASONE FURO-FORMOTEROL FUM 200-5 MCG/ACT IN AERO: 2.0000 | INHALATION_SPRAY | Freq: Two times a day (BID) | RESPIRATORY_TRACT | 11 refills | Status: DC

## 2016-12-21 MED ORDER — ALBUTEROL SULFATE HFA 108 (90 BASE) MCG/ACT IN AERS: 2.0000 | INHALATION_SPRAY | Freq: Four times a day (QID) | RESPIRATORY_TRACT | 2 refills | Status: DC | PRN

## 2016-12-21 NOTE — Progress Notes (Signed)
[facility] Pulmonary Medicine      Assessment and Plan:  61 yo male with a history of OSA and COPD, oxygen dependent.   COPD, oxygen dependent.  --Will prescribe Dulera and proair.  --Continue to try increase activity as tolerated.  --Discussed possibility of pulm rehab, he will consider this.   OSA.  --Known OSA, currently on cpap and tolerating well, will continue.   Tobacco abuse.  --Recently quit, 10/2016, encouraged not to restart.   Morbid obesity.  --Likely contributing to dyspnea, weight loss would be beneficial.   Date: 12/21/2016  MRN# [id number] Aaron Doyle March 23, 1956    Aaron Doyle is a 61 y.o. old male seen in consultation for chief complaint of:    Chief Complaint  Patient presents with  . Hospitalization Follow-up    pt reports sob with exhertion he is on 2.5L contin/DME [facility] pt denies cough/wheezing chest tightness.    HPI:   Aaron Doyle is a 61 y/o male with a history of gout, glaucoma, HTN, hyperlipidemia, obstructive sleep apnea (CPAP), COPD (chronic oxygen), osteoarthritis, current tobacco use and chronic heart failure. He was recently admitted to the hospital in July 2018 with  COPD and heart failure exacerbation, for which he required intubation. He missed his last appt here. He was a smoker until 10/2016 He had inhalers in the past but ran out, and he did not have insurance, he is now on [place].  He uses cpap for osa and feels that this helps, he is much more awake during the day since using it.  He is using oxygen at 2.5L during the day and with PAP.   **ABG, 10/17/2016; 7.49/64/69/21.5; consistent with chronic hypercapnic respiratory failure. **Echo report on 10/17/16 reviewed and shows an EF of 60-65% along with trivial Aaron/TR. Cardiac catheterization done 06/24/15 showed an EF of >55%, normal coronaries and mild pulmonary HTN with a PA pressure of 32 mm Hg.  **Images personally reviewed; chest x-ray 10/21/16, right lower lobe and middle lobe  atelectasis, secondary to infiltrate versus effusion,elevated right diaphragm, left lower lobe atelectasis as well.  Medication:    Current Outpatient Prescriptions:  .  albuterol (PROVENTIL) (2.5 MG/3ML) 0.083% nebulizer solution, Take 3 mLs (2.5 mg total) by nebulization every 6 (six) hours as needed for wheezing or shortness of breath., Disp: 75 mL, Rfl: 2 .  allopurinol (ZYLOPRIM) 300 MG tablet, Take 1 tablet (300 mg total) by mouth at bedtime., Disp: 30 tablet, Rfl: 5 .  aspirin EC 81 MG tablet, Take 81 mg by mouth daily., Disp: , Rfl:  .  atorvastatin (LIPITOR) 40 MG tablet, Take 1 tablet (40 mg total) by mouth daily., Disp: 30 tablet, Rfl: 5 .  carvedilol (COREG) 3.125 MG tablet, Take 1 tablet (3.125 mg total) by mouth 2 (two) times daily with a meal., Disp: 60 tablet, Rfl: 0 .  furosemide (LASIX) 40 MG tablet, Take 1 tablet (40 mg total) by mouth 2 (two) times daily., Disp: 180 tablet, Rfl: 3 .  mometasone-formoterol (DULERA) 100-5 MCG/ACT AERO, Inhale 2 puffs into the lungs 2 (two) times daily. (Patient not taking: Reported on 11/15/2016), Disp: 1 Inhaler, Rfl: 5 .  potassium chloride SA (K-DUR,KLOR-CON) 20 MEQ tablet, Take 1 tablet (20 mEq total) by mouth 2 (two) times daily., Disp: 90 tablet, Rfl: 3 .  tiotropium (SPIRIVA) 18 MCG inhalation capsule, Place 1 capsule (18 mcg total) into inhaler and inhale daily. (Patient not taking: Reported on 11/15/2016), Disp: 30 capsule, Rfl: 5   Allergies:  Patient has no known allergies.  Review of Systems: Gen:  Denies  fever, sweats, chills HEENT: Denies blurred vision, double vision. bleeds, sore throat Cvc:  No dizziness, chest pain. Resp:   Denies cough or sputum production, shortness of breath Gi: Denies swallowing difficulty, stomach pain. Gu:  Denies bladder incontinence, burning urine Ext:   No Joint pain, stiffness. Skin: No skin rash,  hives  Endoc:  No polyuria, polydipsia. Psych: No depression, insomnia. Other:  All other  systems were reviewed with the patient and were negative other that what is mentioned in the HPI.   Physical Examination:   VS: BP (!) 172/92 (BP Location: Left Arm, Cuff Size: Normal)   Pulse (!) 101   Resp 16   Ht 6' (1.829 m)   Wt 260 lb (117.9 kg)   SpO2 96%   BMI 35.26 kg/m   General Appearance: No distress  Neuro:without focal findings,  speech normal,  HEENT: PERRLA, EOM intact.   Pulmonary: normal breath sounds, No wheezing.  CardiovascularNormal S1,S2.  No m/r/g.   Abdomen: Benign, Soft, non-tender. Renal:  No costovertebral tenderness  GU:  No performed at this time. Endoc: No evident thyromegaly, no signs of acromegaly. Skin:   warm, no rashes, no ecchymosis  Extremities: normal, no cyanosis, clubbing.  Other findings:    LABORATORY PANEL:   CBC No results for input(s): WBC, HGB, HCT, PLT in the last 168 hours. ------------------------------------------------------------------------------------------------------------------  Chemistries  No results for input(s): NA, K, CL, CO2, GLUCOSE, BUN, CREATININE, CALCIUM, MG, AST, ALT, ALKPHOS, BILITOT in the last 168 hours.  Invalid input(s): GFRCGP ------------------------------------------------------------------------------------------------------------------  Cardiac Enzymes No results for input(s): TROPONINI in the last 168 hours. ------------------------------------------------------------  RADIOLOGY:  No results found.     Thank  you for the consultation and for allowing [facility] Pulmonary, Critical Care to assist in the care of your patient. Our recommendations are noted above.  Please contact us if we can be of further service.   Wells Guiles, MD.  Board Certified in Internal Medicine, Pulmonary Medicine, Critical Care Medicine, and Sleep Medicine.  [place] [facility] and Critical [facility] Office Number: [phone]  Santiago Glad, M.D.  Billy Fischer, M.D  12/21/2016

## 2016-12-21 NOTE — Patient Instructions (Signed)
Continue dulera.  Continue CPAP every night.

## 2017-01-18 ENCOUNTER — Other Ambulatory Visit: Payer: Self-pay | Admitting: Family

## 2017-01-18 MED ORDER — POTASSIUM CHLORIDE CRYS ER 20 MEQ PO TBCR: 20.0000 meq | EXTENDED_RELEASE_TABLET | Freq: Two times a day (BID) | ORAL | 5 refills | Status: DC

## 2017-09-21 ENCOUNTER — Other Ambulatory Visit: Payer: Self-pay | Admitting: Family

## 2017-09-21 MED ORDER — POTASSIUM CHLORIDE CRYS ER 20 MEQ PO TBCR: 20.0000 meq | EXTENDED_RELEASE_TABLET | Freq: Two times a day (BID) | ORAL | 0 refills | Status: AC

## 2017-11-16 ENCOUNTER — Other Ambulatory Visit: Payer: Self-pay | Admitting: *Deleted

## 2017-11-16 MED ORDER — ALBUTEROL SULFATE HFA 108 (90 BASE) MCG/ACT IN AERS: 2.0000 | INHALATION_SPRAY | Freq: Four times a day (QID) | RESPIRATORY_TRACT | 2 refills | Status: AC | PRN

## 2017-11-27 IMAGING — CR DG CHEST 2V
1 series · 2 of 2 positions shown · non-contrast
Comparison: Radiograph dated 12/26/2009

CLINICAL DATA: 59-year-old male with shortness of breath

EXAM:
CHEST  2 VIEW

[Series 1: dg chest 2 view · 0.14mm/px · 2 of 2 slices shown]
[im 1/2]
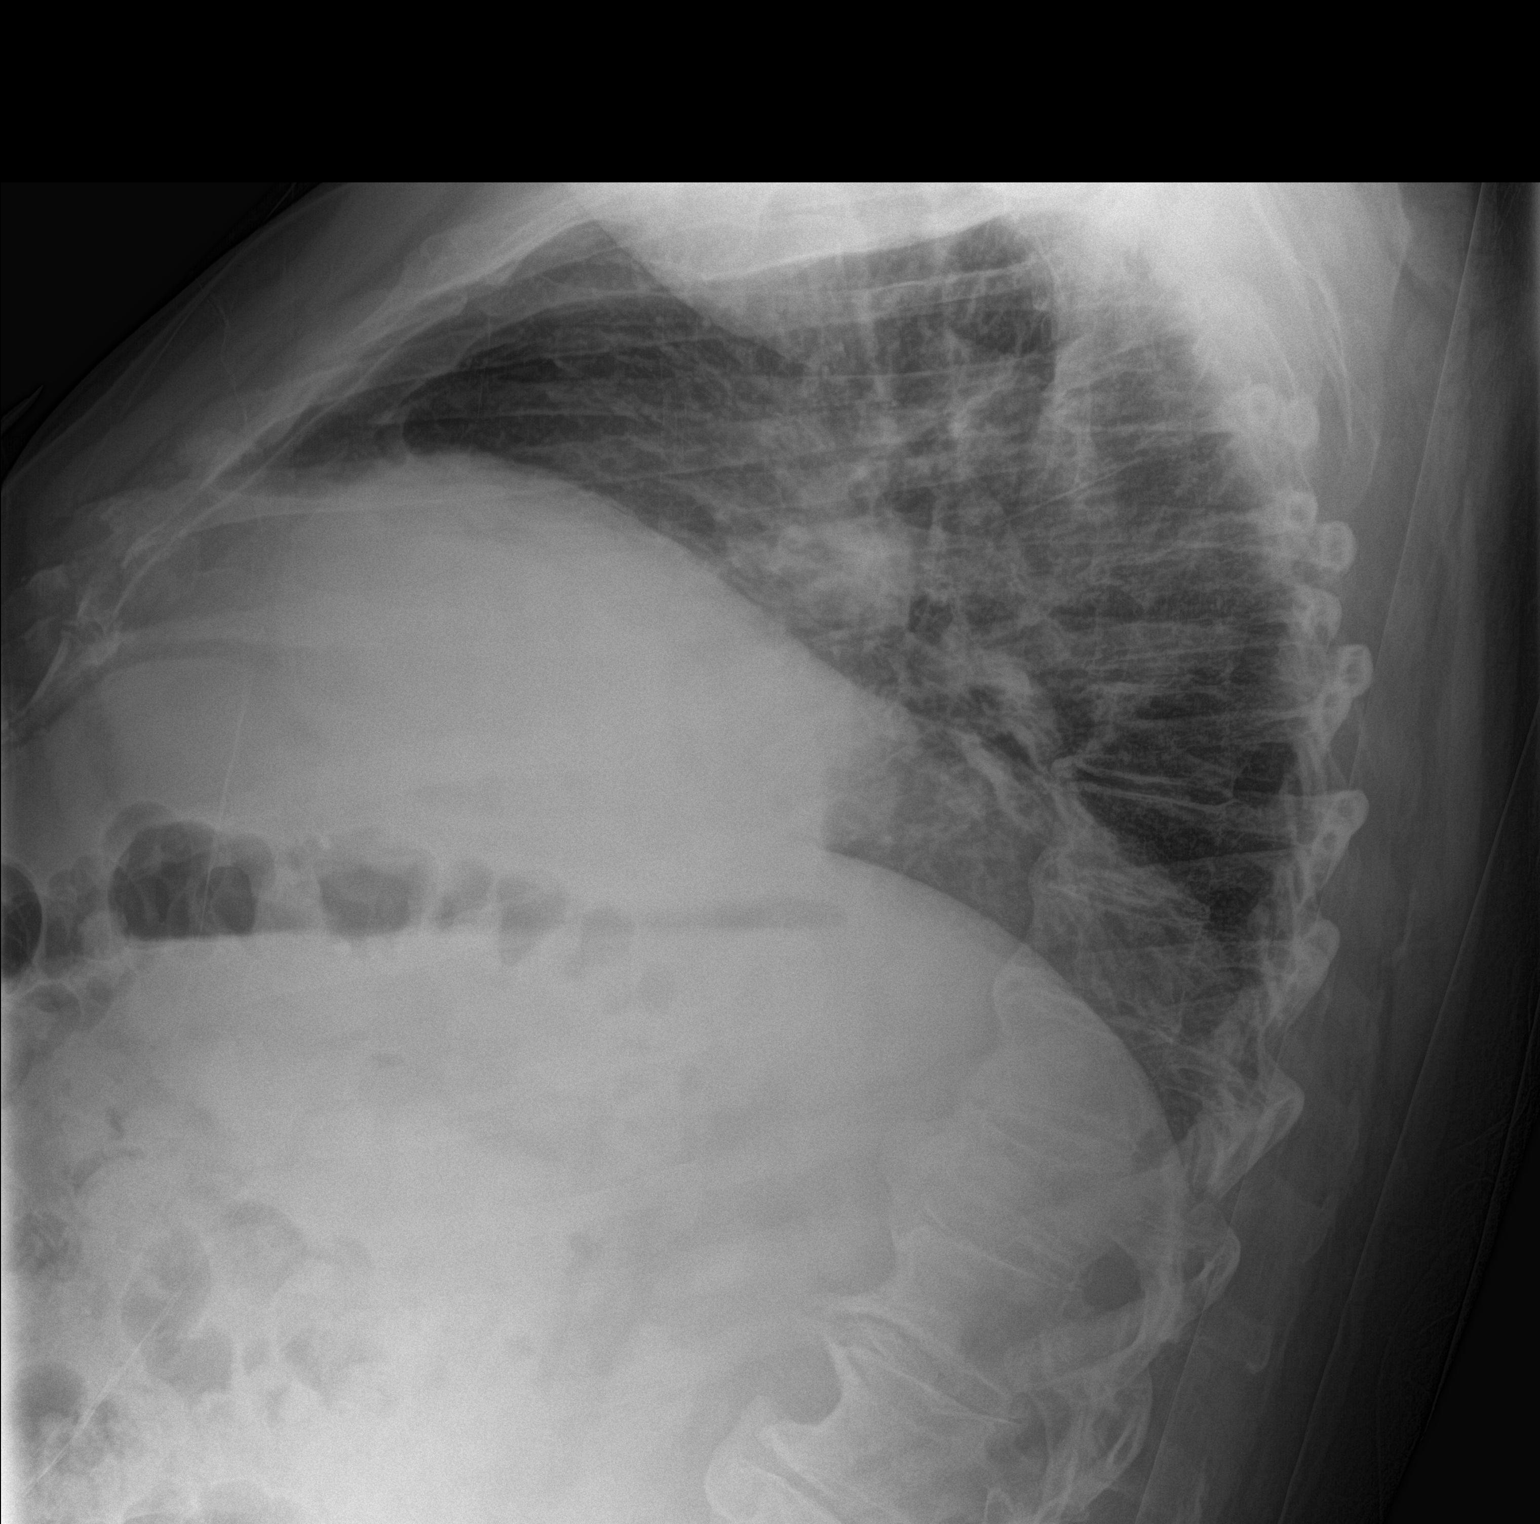
[im 2/2]
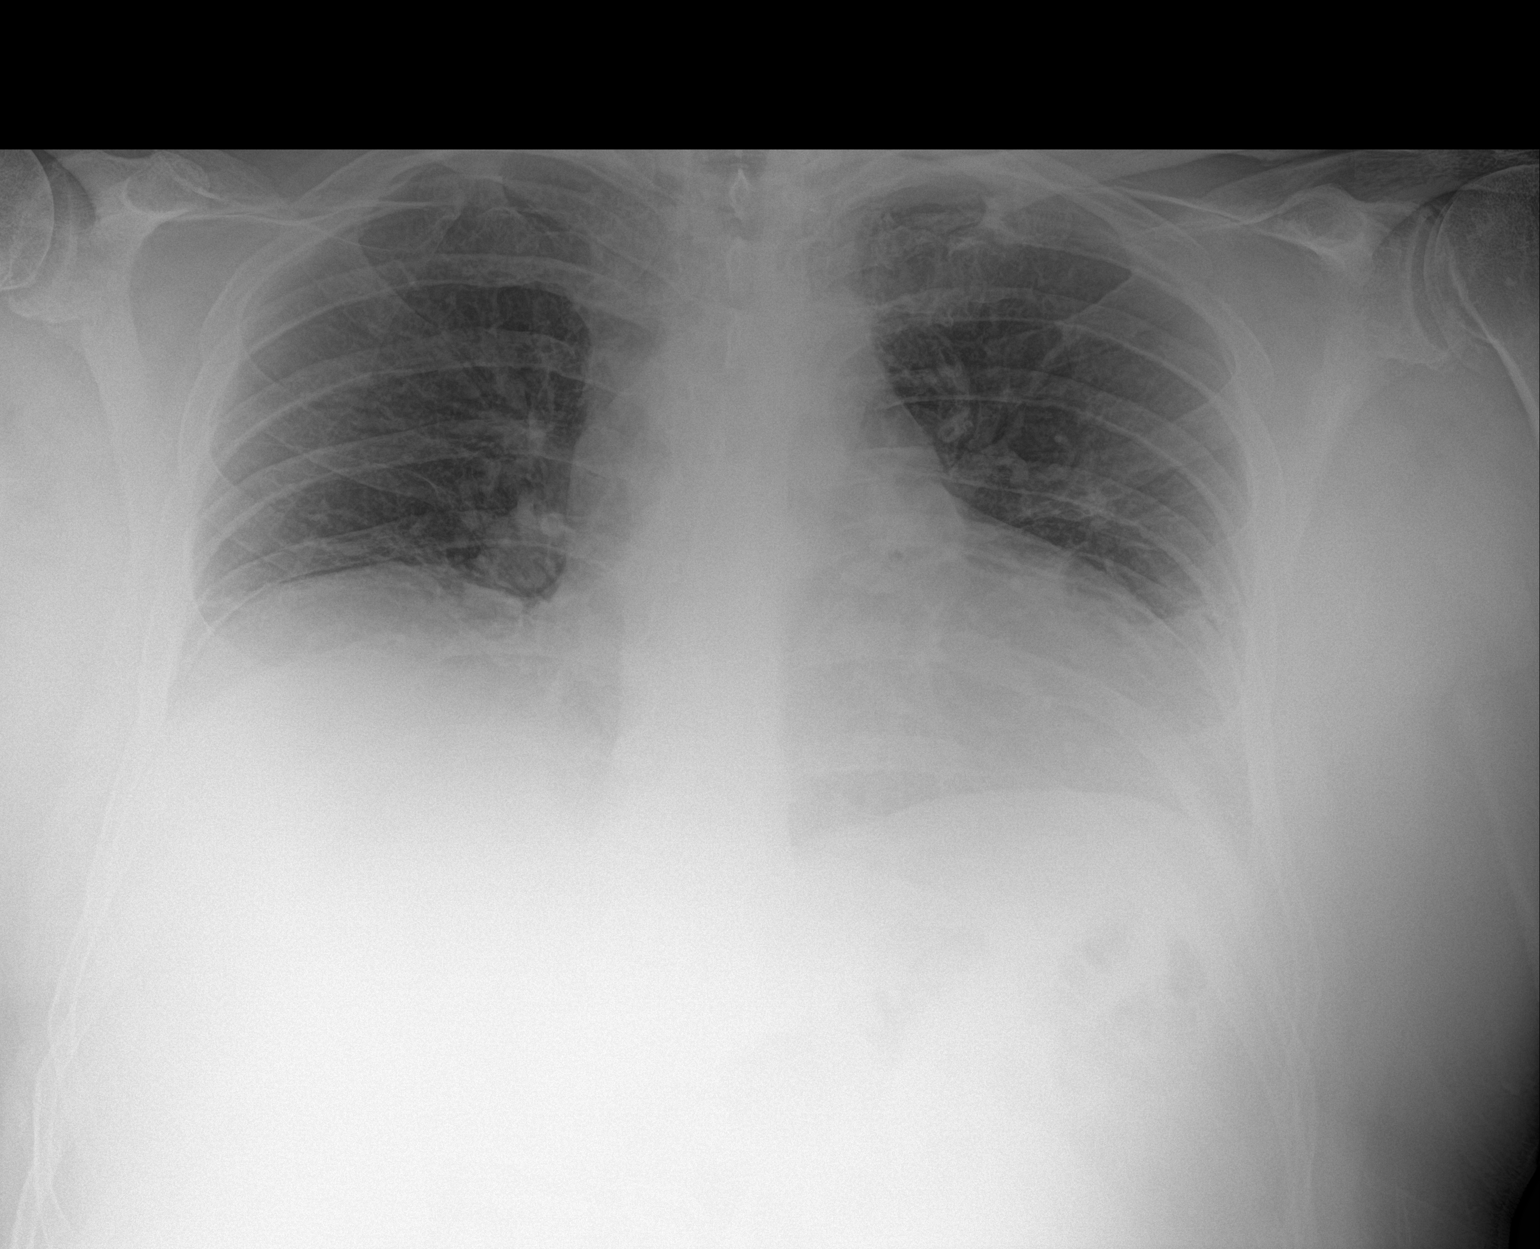

[2 of 2 positions shown; findings below may reference images not displayed]

FINDINGS: There is shallow inspiration. Bibasilar atelectatic changes noted.
Pneumonia is less likely. Clinical correlation is recommended. There
is no pleural effusion or pneumothorax. Stable cardiac silhouette.
There is degenerative changes of the shoulders and spine. Lower
cervical fixation plate and screws noted. No acute osseous
pathology.
IMPRESSION: Shallow inspiration with bibasilar atelectasis.

## 2017-12-11 ENCOUNTER — Encounter: Payer: Self-pay | Admitting: Internal Medicine

## 2017-12-12 NOTE — Progress Notes (Addendum)
[facility] [place] [facility]      Assessment and Plan:  62 yo male with a history of OSA and COPD, oxygen dependent.   COPD, oxygen dependent. --Continue Dulera. - We will order walk test to see if he may qualify for portable oxygen concentrator. --Flu vaccine today.  --s/p PPSV23 10/23/15   Dyspnea on exertion - Likely secondary to deconditioning and COPD. - We will refer to pulmonary rehab.  OSA. --Known OSA, currently on cpap and tolerating well, will continue.  - Residual apnea index of 5. - We will change pressure range from 5-20 to 9-20.  History of tobacco abuse. --Recently quit, 10/2016, encouraged not to restart.   Morbid obesity. --Discussed increasing physical activity.  Date: 12/12/2017  MRN# [id number] HERCHEL HOPKIN 12/16/1955    Aaron Doyle is a 62 y.o. old male seen in consultation for chief complaint of:    Chief Complaint  Patient presents with  . Follow-up  . COPD    SOB w/activity:   . Sleep Apnea    doing well on CPAP    HPI:   Mr Gaunt is a 62 y/o male with a history of gout, glaucoma, HTN, hyperlipidemia, obstructive sleep apnea (CPAP), COPD (chronic oxygen), osteoarthritis, current tobacco use and chronic heart failure. He was admitted to the hospital in July 2018 with COPD and heart failure exacerbation, for which he required intubation. He missed his last appt here. He was a smoker until 10/2016.  He has a history of obstructive sleep apnea on CPAP.  Uses oxygen at 2.5 L during the day and with CPAP. At last visit he was asked to continue [facility] and pro-air. Today he feels that continues to have dyspnea on exertion, he has been doing well with CPAP, and feels more awake during the day and is long snoring.  He is using dulera 2 puffs twice daily, rinses mouth. He uses proventil about once or day or less. He has a nebulizer but has not had to use it in the past month.  He has been very sedentary.   **CPAP download  11/12/2017-12/11/2017>> raw data personally reviewed.  Usage greater than 4 hours 30/30 days.  Average usage on all days is 9 hours 20 minutes.  Pressure ranges 4-20.  Median pressure 11, 95th percentile pressure 15, maximum pressure 17.  Residual AHI 5.5 with minimal central apnea. **ABG, 10/17/2016>> 7.49/64/69/21.5; consistent with chronic hypercapnic respiratory failure. **Echo report on 10/17/16>> reviewed and shows an EF of 60-65% along with trivial MR/TR. Cardiac catheterization done 06/24/15 showed an EF of >55%, normal coronaries and mild pulmonary HTN with a PA pressure of 32 mm Hg. **chest x-ray 10/21/16>> right lower lobe and middle lobe atelectasis, secondary to infiltrate versus effusion,elevated right diaphragm, left lower lobe atelectasis as well.  Medication:    Current Outpatient Medications:  .  albuterol (PROVENTIL HFA;VENTOLIN HFA) 108 (90 Base) MCG/ACT inhaler, Inhale 2 puffs into the lungs every 6 (six) hours as needed for wheezing or shortness of breath., Disp: 1 Inhaler, Rfl: 2 .  albuterol (PROVENTIL) (2.5 MG/3ML) 0.083% nebulizer solution, Take 3 mLs (2.5 mg total) by nebulization every 6 (six) hours as needed for wheezing or shortness of breath., Disp: 75 mL, Rfl: 2 .  allopurinol (ZYLOPRIM) 300 MG tablet, Take 1 tablet (300 mg total) by mouth at bedtime., Disp: 30 tablet, Rfl: 5 .  aspirin EC 81 MG tablet, Take 81 mg by mouth daily., Disp: , Rfl:  .  atorvastatin (LIPITOR) 40 MG tablet,  Take 1 tablet (40 mg total) by mouth daily., Disp: 30 tablet, Rfl: 5 .  carvedilol (COREG) 3.125 MG tablet, Take 1 tablet (3.125 mg total) by mouth 2 (two) times daily with a meal., Disp: 60 tablet, Rfl: 0 .  furosemide (LASIX) 40 MG tablet, Take 1 tablet (40 mg total) by mouth 2 (two) times daily., Disp: 180 tablet, Rfl: 3 .  mometasone-formoterol (DULERA) 200-5 MCG/ACT AERO, Inhale 2 puffs into the lungs 2 (two) times daily. Rinse mouth after use., Disp: 1 Inhaler, Rfl: 11 .  potassium  chloride SA (K-DUR,KLOR-CON) 20 MEQ tablet, Take 1 tablet (20 mEq total) by mouth 2 (two) times daily. Needs office visit before further refills given, Disp: 60 tablet, Rfl: 0 .  tiotropium (SPIRIVA) 18 MCG inhalation capsule, Place 1 capsule (18 mcg total) into inhaler and inhale daily. (Patient not taking: Reported on 12/21/2016), Disp: 30 capsule, Rfl: 5   Allergies:  Patient has no known allergies.    Review of Systems:  Constitutional: Feels well. Cardiovascular: No chest pain.  Pulmonary: Denies hemoptysis The remainder of systems were reviewed and were found to be negative other than what is documented in the HPI.   Physical Examination:   VS: BP (!) 150/100 (BP Location: Left Arm, Cuff Size: Normal)   Pulse (!) 104   Ht 6' (1.829 m)   Wt 264 lb (119.7 kg)   SpO2 92%   BMI 35.80 kg/m   General Appearance: No distress  Neuro:without focal findings, mental status, speech normal, alert and oriented HEENT: PERRLA, EOM intact Pulmonary: No wheezing, No rales  CardiovascularNormal S1,S2.  No m/r/g.  Abdomen: Benign, Soft, non-tender, No masses Renal:  No costovertebral tenderness  GU:  No performed at this time. Endoc: No evident thyromegaly, no signs of acromegaly or Cushing features Skin:   warm, no rashes, no ecchymosis  Extremities: normal, no cyanosis, clubbing.    LABORATORY PANEL:   CBC No results for input(s): WBC, HGB, HCT, PLT in the last 168 hours. ------------------------------------------------------------------------------------------------------------------  Chemistries  No results for input(s): NA, K, CL, CO2, GLUCOSE, BUN, CREATININE, CALCIUM, MG, AST, ALT, ALKPHOS, BILITOT in the last 168 hours.  Invalid input(s): GFRCGP ------------------------------------------------------------------------------------------------------------------  Cardiac Enzymes No results for input(s): TROPONINI in the last 168  hours. ------------------------------------------------------------  RADIOLOGY:  No results found.     Thank  you for the consultation and for allowing [facility] to assist in the care of your patient. Our recommendations are noted above.  Please contact us if we can be of further service.  Wells Guileseep Quetzally Callas, M.D., F.C.C.P.  Board Certified in Internal Medicine, Pulmonary Medicine, Critical Care Medicine, and Sleep Medicine.  [place] [facility] Office Number: [phone]   12/12/2017

## 2017-12-13 ENCOUNTER — Encounter: Payer: Self-pay | Admitting: Internal Medicine

## 2017-12-13 ENCOUNTER — Ambulatory Visit (INDEPENDENT_AMBULATORY_CARE_PROVIDER_SITE_OTHER): Payer: Medicare Other | Admitting: Internal Medicine

## 2017-12-13 VITALS — BP 150/100 | HR 104 | Ht 72.0 in | Wt 264.0 lb

## 2017-12-13 DIAGNOSIS — Z23 Encounter for immunization: Secondary | ICD-10-CM

## 2017-12-13 DIAGNOSIS — G4733 Obstructive sleep apnea (adult) (pediatric): Secondary | ICD-10-CM

## 2017-12-13 DIAGNOSIS — J449 Chronic obstructive pulmonary disease, unspecified: Secondary | ICD-10-CM

## 2017-12-13 NOTE — Addendum Note (Signed)
Addended by: Meyer CoryAHMAD, Toree Edling R on: 12/13/2017 11:51 AM   Modules accepted: Orders

## 2017-12-13 NOTE — Patient Instructions (Addendum)
Continue using CPAP, will change pressure range to 9-20.  Will order walk test to see if you qualify for portable oxygen concentrator.  Will refer to pulmonary rehab.  Continue your current inhalers.

## 2017-12-16 ENCOUNTER — Telehealth: Payer: Self-pay | Admitting: Internal Medicine

## 2017-12-16 ENCOUNTER — Ambulatory Visit: Payer: Medicare Other

## 2017-12-16 NOTE — Telephone Encounter (Signed)
Called patient and r/s apt. Nothing further needed at this time.

## 2017-12-16 NOTE — Telephone Encounter (Signed)
PT WOULD LIKE TO R/S HIS WALK TEST

## 2017-12-21 ENCOUNTER — Ambulatory Visit: Payer: Medicare Other

## 2017-12-21 DIAGNOSIS — J449 Chronic obstructive pulmonary disease, unspecified: Secondary | ICD-10-CM

## 2017-12-30 ENCOUNTER — Telehealth: Payer: Self-pay | Admitting: Internal Medicine

## 2017-12-30 NOTE — Telephone Encounter (Signed)
Spoke to [employer] @[facility] . Patient needs O2 qualifying walk before can proceed with POC. Spoke with patient and scheduled walk for 01/06/18.

## 2017-12-30 NOTE — Addendum Note (Signed)
Addended by: Erlinda Hong on: 12/30/2017 02:36 PM   Modules accepted: Orders

## 2017-12-30 NOTE — Telephone Encounter (Signed)
Barbara Cower from [facility] calling Is needing additional information regarding patient  Please call to discuss after 2pm

## 2018-01-03 ENCOUNTER — Ambulatory Visit: Payer: Medicare Other

## 2018-01-06 ENCOUNTER — Ambulatory Visit (INDEPENDENT_AMBULATORY_CARE_PROVIDER_SITE_OTHER): Payer: Medicare Other | Admitting: *Deleted

## 2018-01-06 DIAGNOSIS — J449 Chronic obstructive pulmonary disease, unspecified: Secondary | ICD-10-CM | POA: Diagnosis not present

## 2018-01-06 NOTE — Progress Notes (Signed)
SIX MIN WALK 01/06/2018 12/21/2017  Medications Albuterol HFA/ASA 81 mg/Atorvastatin 40 mg/ Carvediol 3.125 mg/ Lasix 40 mg/ Lisinopril 5 mg/ Dulera 200/Metformin 1000 mg -  Supplimental Oxygen during Test? (L/min) No -  Laps 4 -  Partial Lap (in Meters) 0 -  Baseline BP (sitting) 156/90 -  Baseline Heartrate 83 -  Baseline Dyspnea (Borg Scale) 4 -  Baseline Fatigue (Borg Scale) 0 -  Baseline SPO2 95 -  BP (sitting) 172/98 -  Heartrate 141 -  Dyspnea (Borg Scale) 10 -  Fatigue (Borg Scale) 3 -  SPO2 92 -  BP (sitting) 172/88 -  Heartrate 92 -  SPO2 99 -  Stopped or Paused before Six Minutes No -  Distance Completed 192 -  Tech Comments: Pt only complained of knee pain in which he had to take several seconds to recover. He did not de-sat. starting BP: 150/90. patient ambulated with POC at moderate pace.     SMW performed today.

## 2018-02-20 ENCOUNTER — Other Ambulatory Visit: Payer: Self-pay | Admitting: *Deleted

## 2018-02-20 MED ORDER — FLUTICASONE FUROATE-VILANTEROL 200-25 MCG/INH IN AEPB: 1.0000 | INHALATION_SPRAY | Freq: Every day | RESPIRATORY_TRACT | 5 refills | Status: AC

## 2018-08-11 ENCOUNTER — Ambulatory Visit (INDEPENDENT_AMBULATORY_CARE_PROVIDER_SITE_OTHER): Payer: Medicare Other | Admitting: Internal Medicine

## 2018-08-11 DIAGNOSIS — G4733 Obstructive sleep apnea (adult) (pediatric): Secondary | ICD-10-CM

## 2018-08-11 DIAGNOSIS — J449 Chronic obstructive pulmonary disease, unspecified: Secondary | ICD-10-CM | POA: Diagnosis not present

## 2018-08-11 MED ORDER — ALBUTEROL SULFATE HFA 108 (90 BASE) MCG/ACT IN AERS: 2.0000 | INHALATION_SPRAY | Freq: Four times a day (QID) | RESPIRATORY_TRACT | 2 refills | Status: AC | PRN

## 2018-08-11 NOTE — Addendum Note (Signed)
Addended by: Maxwell Marion A on: 08/11/2018 09:54 AM   Modules accepted: Orders

## 2018-08-11 NOTE — Progress Notes (Signed)
[facility]      Virtual Visit via Telephone Note I connected with patient on 08/11/18 at  9:30 AM EDT by telephone and verified that I am speaking with the correct person using two identifiers.   I discussed the limitations, risks, security and privacy concerns of performing an evaluation and management service by telephone and the availability of in person appointments. I also discussed with the patient that there may be a patient responsible charge related to this service. The patient expressed understanding and agreed to proceed. I discussed the assessment and treatment plan with the patient. The patient was provided an opportunity to ask questions and all were answered. The patient agreed with the plan and demonstrated an understanding of the instructions. Please see note below for further detail.    The patient was advised to call back or seek an in-person evaluation if the symptoms worsen or if the condition fails to improve as anticipated.  I provided 25 minutes of non-face-to-face time during this encounter.   Aaron CrutchPradeep Taliesin Hartlage, MD    Assessment and Plan:  63 yo male with a history of OSA and COPD, oxygen dependent.   COPD, oxygen dependent. --Continue Spiriva and Breo. --s/p PPSV23 10/23/15 - Would benefit from lung cancer screening.  Will consider once COVID crisis is over.   Dyspnea on exertion - Likely secondary to deconditioning and COPD. - Patient would benefit from pulmonary rehab, however this is currently unavailable due to the COVID crisis, will reconsider once crisis is over and pulmonary rehab is reopened.  OSA. --Known OSA, review of download shows OSA well controlled, however the patient's leaks are elevated.  He notes that his mask has deteriorated, will order new mask and supplies. - Residual apnea index of 4. - Continue CPAP with pressure range 9-20.  History of tobacco abuse. --Quit 10/2016, encouraged not to restart.   Morbid  obesity. --Discussed increasing physical activity.  Date: 08/11/2018  MRN# [id number] Aaron Doyle [birth year]    Aaron Doyle is a 63 y.o. old male seen in consultation for chief complaint of: COPD, OSA.      HPI:   Aaron Doyle is a 63 y/o male with a history of gout, glaucoma, HTN, hyperlipidemia, obstructive sleep apnea (CPAP), COPD (chronic oxygen), osteoarthritis,  tobacco use and chronic heart failure.  He has a history of hospital admission requiring intubation in 2018 for COPD exacerbation. At last visit was asked to continue CPAP, Dulera, referred to pulmonary rehab as it was noted that he was very sedentary. Since his last visit he feels that his breathing has been doing ok. He is using Breo once per day, and using spiriva once daily, and feels that they are helping. He is on oxygen on 2.5L all the time and also with sleep.  He is wearing his cpap every night and is in need of new supplies. [employer] notified that he had to be seen by physician first.  At last visit we increased his CPAP range from 5-20 9-20, he notes that he noticed a difference soon after and he was more awake during the day. He is not smoking, he has not been very active.  His albuterol ran out (ventolin), he was using it recently due to allergies. Other than that he uses it about once per week.   **CPAP download 07/12/2018-08/10/2018>> raw data personally reviewed.  Usage greater than 4 hours is 30/30 days.  Average usage on days used is 8 hours 53 minutes.  Pressure ranges 9-20.  Median pressure 11, 95th percentile pressure 14, maximum pressure 16.  Residual AHI is 3.9, leaks are elevated. **6-minute walk test 01/06/2018>> 190 feet, did not desat. **CPAP download 11/12/2017-12/11/2017>> raw data personally reviewed.  Usage greater than 4 hours 30/30 days.  Average usage on all days is 9 hours 20 minutes.  Pressure ranges 4-20.  Median pressure 11, 95th percentile pressure 15, maximum pressure 17.  Residual AHI 5.5  with minimal central apnea. **ABG, 10/17/2016>> 7.49/64/69/21.5; consistent with chronic hypercapnic respiratory failure. **Echo report on 10/17/16>> reviewed and shows an EF of 60-65% along with trivial Aaron/TR. Cardiac catheterization done 06/24/15 showed an EF of >55%, normal coronaries and mild pulmonary HTN with a PA pressure of 32 mm Hg. **chest x-ray 10/21/16>> right lower lobe and middle lobe atelectasis, secondary to infiltrate versus effusion,elevated right diaphragm, left lower lobe atelectasis as well.  Medication:    Current Outpatient Medications:  .  albuterol (PROVENTIL HFA;VENTOLIN HFA) 108 (90 Base) MCG/ACT inhaler, Inhale 2 puffs into the lungs every 6 (six) hours as needed for wheezing or shortness of breath., Disp: 1 Inhaler, Rfl: 2 .  albuterol (PROVENTIL) (2.5 MG/3ML) 0.083% nebulizer solution, Take 3 mLs (2.5 mg total) by nebulization every 6 (six) hours as needed for wheezing or shortness of breath., Disp: 75 mL, Rfl: 2 .  allopurinol (ZYLOPRIM) 300 MG tablet, Take 1 tablet (300 mg total) by mouth at bedtime., Disp: 30 tablet, Rfl: 5 .  aspirin EC 81 MG tablet, Take 81 mg by mouth daily., Disp: , Rfl:  .  atorvastatin (LIPITOR) 40 MG tablet, Take 1 tablet (40 mg total) by mouth daily., Disp: 30 tablet, Rfl: 5 .  carvedilol (COREG) 3.125 MG tablet, Take 1 tablet (3.125 mg total) by mouth 2 (two) times daily with a meal., Disp: 60 tablet, Rfl: 0 .  fluticasone furoate-vilanterol (BREO ELLIPTA) 200-25 MCG/INH AEPB, Inhale 1 puff into the lungs daily., Disp: 60 each, Rfl: 5 .  furosemide (LASIX) 40 MG tablet, Take 1 tablet (40 mg total) by mouth 2 (two) times daily., Disp: 180 tablet, Rfl: 3 .  lisinopril (PRINIVIL,ZESTRIL) 5 MG tablet, Take 5 mg by mouth daily., Disp: , Rfl: 0 .  metFORMIN (GLUCOPHAGE) 1000 MG tablet, Take 1,000 mg by mouth 2 (two) times daily., Disp: , Rfl: 3 .  potassium chloride SA (K-DUR,KLOR-CON) 20 MEQ tablet, Take 1 tablet (20 mEq total) by mouth 2 (two)  times daily. Needs office visit before further refills given, Disp: 60 tablet, Rfl: 0   Allergies:  Patient has no known allergies.    Review of Systems:  Constitutional: Feels well. Cardiovascular: No chest pain.  Pulmonary: Denies hemoptysis The remainder of systems were reviewed and were found to be negative other than what is documented in the HPI.   Physical Examination:   VS: There were no vitals taken for this visit.  General Appearance: No distress  Neuro:without focal findings, mental status, speech normal, alert and oriented HEENT: PERRLA, EOM intact Pulmonary: No wheezing, No rales  CardiovascularNormal S1,S2.  No m/r/g.  Abdomen: Benign, Soft, non-tender, No masses Renal:  No costovertebral tenderness  GU:  No performed at this time. Endoc: No evident thyromegaly, no signs of acromegaly or Cushing features Skin:   warm, no rashes, no ecchymosis  Extremities: normal, no cyanosis, clubbing.    LABORATORY PANEL:   CBC No results for input(s): WBC, HGB, HCT, PLT in the last 168 hours. ------------------------------------------------------------------------------------------------------------------  Chemistries  No results for input(s): NA, K,  CL, CO2, GLUCOSE, BUN, CREATININE, CALCIUM, MG, AST, ALT, ALKPHOS, BILITOT in the last 168 hours.  Invalid input(s): GFRCGP ------------------------------------------------------------------------------------------------------------------  Cardiac Enzymes No results for input(s): TROPONINI in the last 168 hours. ------------------------------------------------------------  RADIOLOGY:  No results found.     Thank  you for the consultation and for allowing [facility] [place] [facility] to assist in the care of your patient. Our recommendations are noted above.  Please contact us if we can be of further service.  Wells Guiles, M.D., F.C.C.P.  Board Certified in Internal Medicine, Pulmonary Medicine,  Critical Care Medicine, and Sleep Medicine.  [place] [facility] Office Number: [phone]   08/11/2018

## 2018-08-11 NOTE — Patient Instructions (Addendum)
Continue using Breo, Spiriva, ventolin.  We will renew your CPAP supplies.  I have sent in a new prescription for ventolin, however this may not be covered. If that is the case use your albuterol nebulizer.  Continue using CPAP every night with oxygen.

## 2018-09-18 ENCOUNTER — Telehealth: Payer: Self-pay | Admitting: Internal Medicine

## 2018-09-18 NOTE — Telephone Encounter (Signed)
Called and spoke to Aaron Doyle with [employer], who is requesting that last OV notes be faxed to [phone]. Notes have been faxed provided fax number. Nothing further is needed.

## 2019-01-17 IMAGING — CR DG CHEST 2V
1 series · 2 of 2 positions shown · non-contrast
Comparison: 10/18/2016

CLINICAL DATA: Shortness of breath and wheezing.  History of COPD.

EXAM:
CHEST  2 VIEW

[Series 1: dg chest 2 view · 0.14mm/px · 2 of 2 slices shown]
[im 1/2]
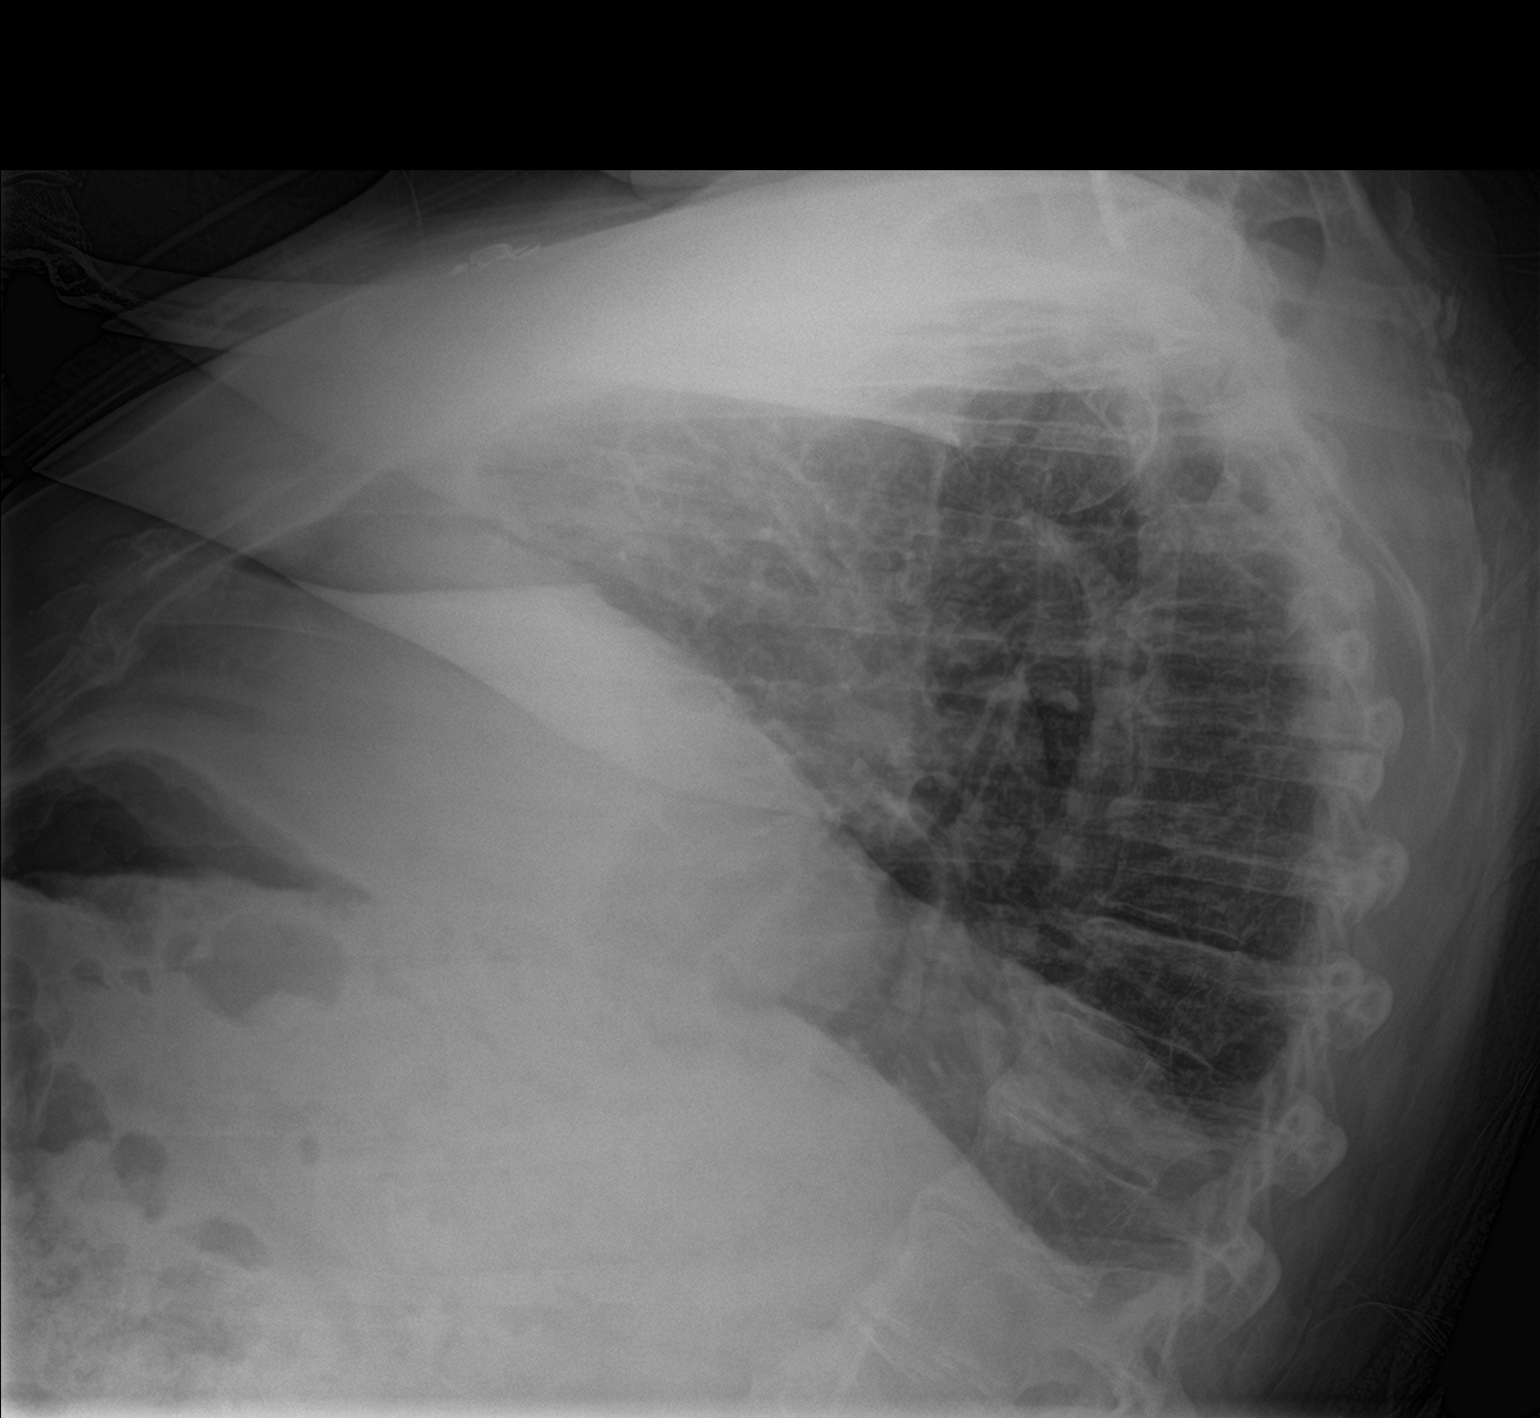
[im 2/2]
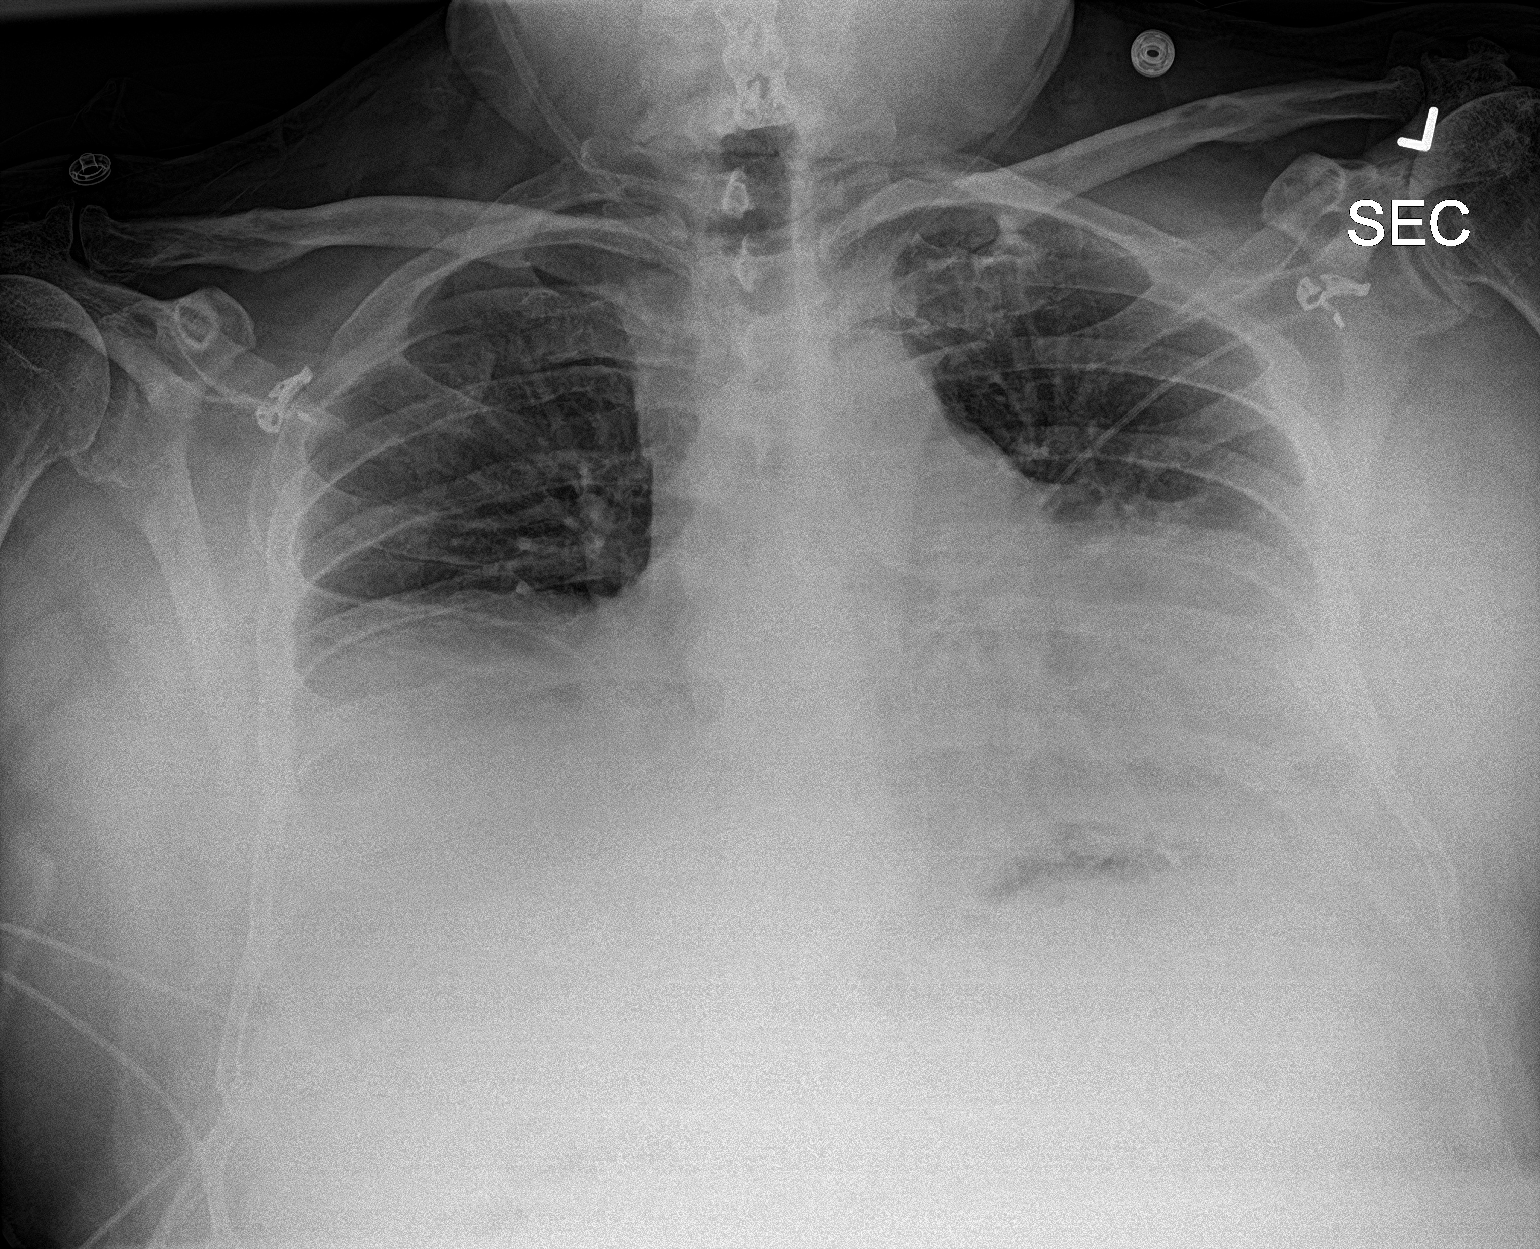

[2 of 2 positions shown; findings below may reference images not displayed]

FINDINGS: Shallow inspiration with elevation of the right hemidiaphragm.
Atelectasis or consolidation in the lung bases. Cardiac enlargement
without vascular congestion. No evidence of edema. Can't exclude
bilateral pleural effusions. No pneumothorax. Degenerative changes
in the spine. Postoperative changes in the cervical spine.
Appliances have been removed since previous study.
IMPRESSION: Shallow inspiration with atelectasis or consolidation in the lung
bases. Can't exclude small bilateral pleural effusions. Cardiac
enlargement.

## 2019-04-13 ENCOUNTER — Other Ambulatory Visit (HOSPITAL_COMMUNITY): Payer: Self-pay | Admitting: Family Medicine

## 2019-04-13 ENCOUNTER — Other Ambulatory Visit: Payer: Self-pay | Admitting: Family Medicine

## 2019-04-13 DIAGNOSIS — R7401 Elevation of levels of liver transaminase levels: Secondary | ICD-10-CM

## 2019-04-18 ENCOUNTER — Ambulatory Visit (HOSPITAL_COMMUNITY): Payer: Medicare Other

## 2019-05-03 ENCOUNTER — Other Ambulatory Visit: Payer: Self-pay

## 2019-05-03 ENCOUNTER — Ambulatory Visit (HOSPITAL_COMMUNITY)
Admission: RE | Admit: 2019-05-03 | Discharge: 2019-05-03 | Disposition: A | Discharge status: Home / Self Care | Payer: Medicare Other | Source: Ambulatory Visit | Attending: Family Medicine | Admitting: Family Medicine

## 2019-05-03 DIAGNOSIS — R7401 Elevation of levels of liver transaminase levels: Secondary | ICD-10-CM | POA: Diagnosis present

## 2021-07-29 IMAGING — US US ABDOMEN LIMITED
1 series · 14 of 25 positions shown · non-contrast
Comparison: 12/28/2014 CT angiogram of the abdomen and pelvis.

CLINICAL DATA: Elevated liver enzymes.

EXAM:
ULTRASOUND ABDOMEN LIMITED RIGHT UPPER QUADRANT

[Series 1: us abdomen limited · 0.21mm/px · 14 of 86 slices shown]
[im 1/86]
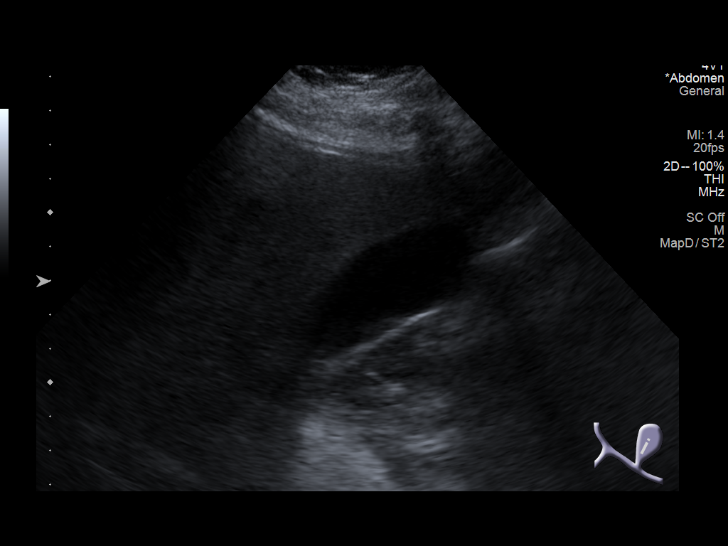
[im 8/86]
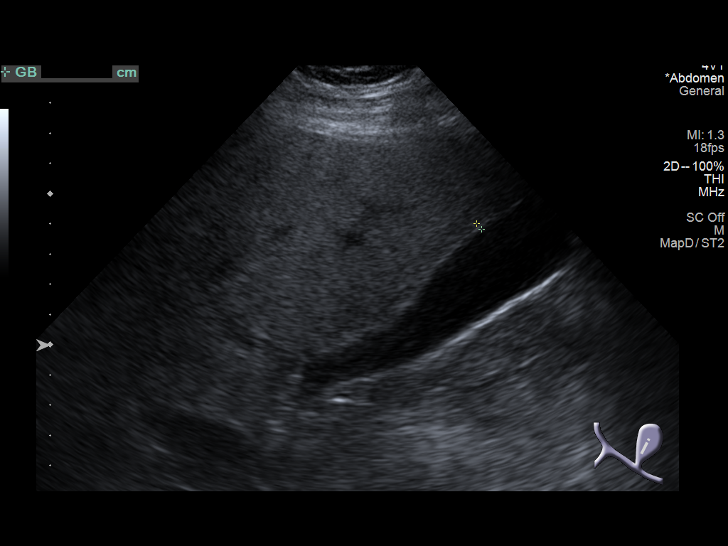
[im 15/86]
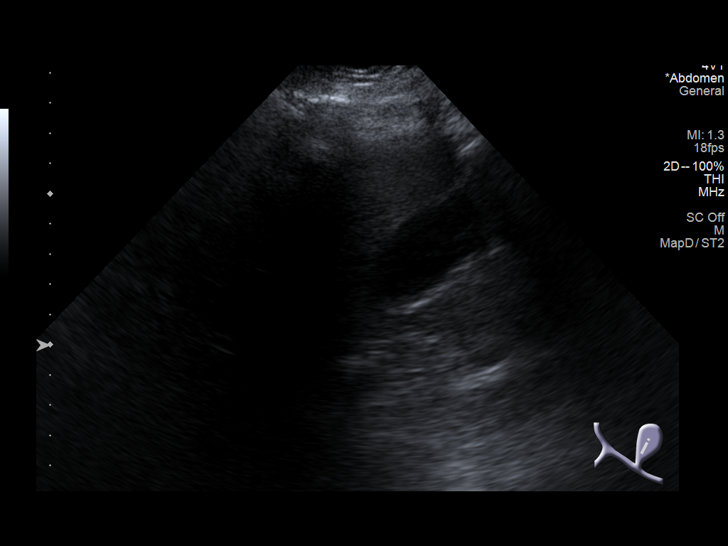
[im 22/86]
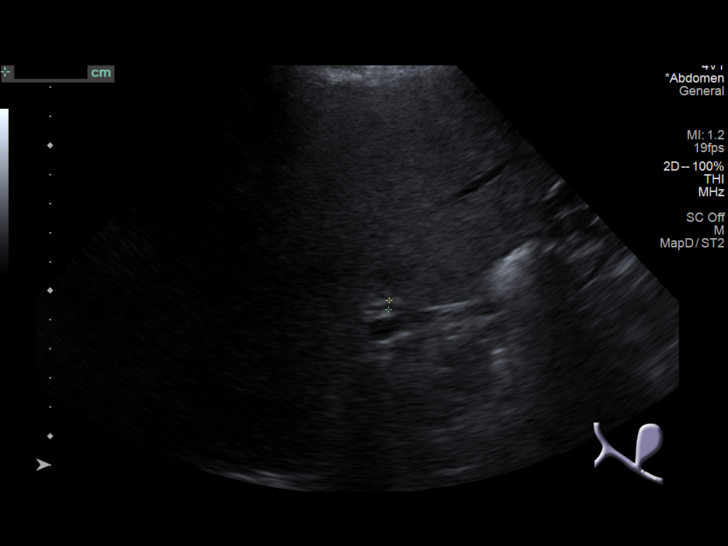
[im 29/86]
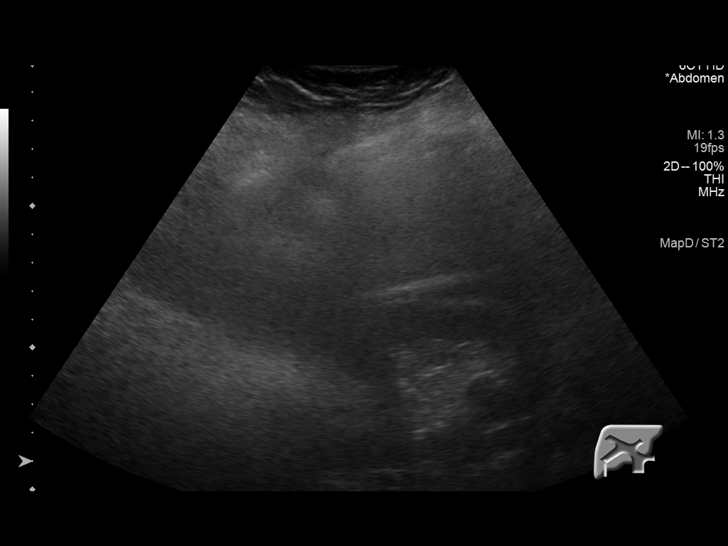
[im 32/86]
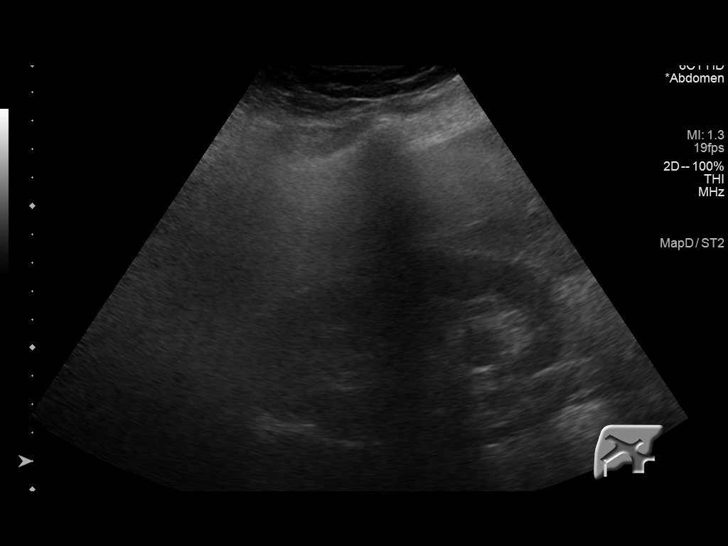
[im 39/86]
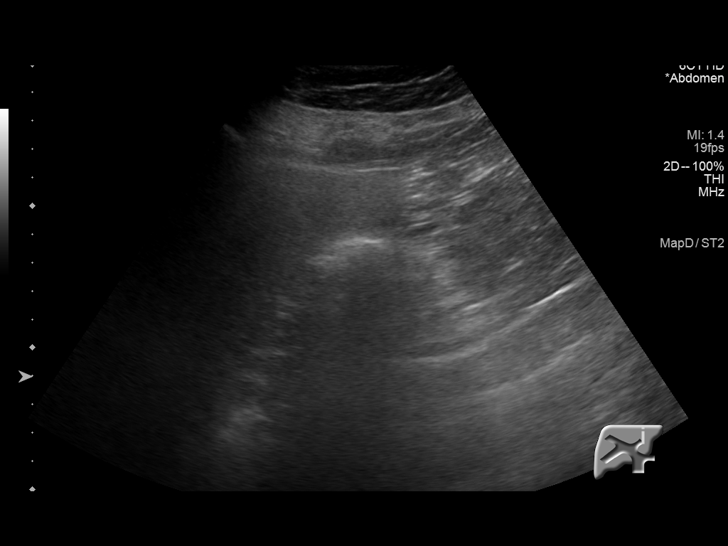
[im 47/86]
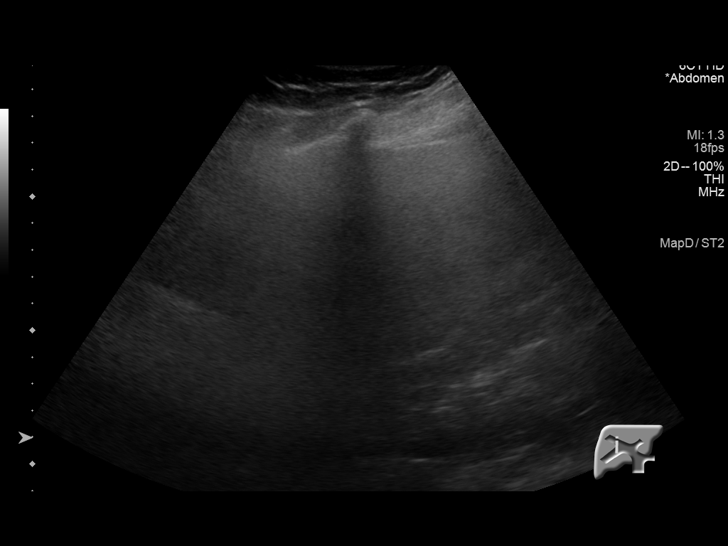
[im 54/86]
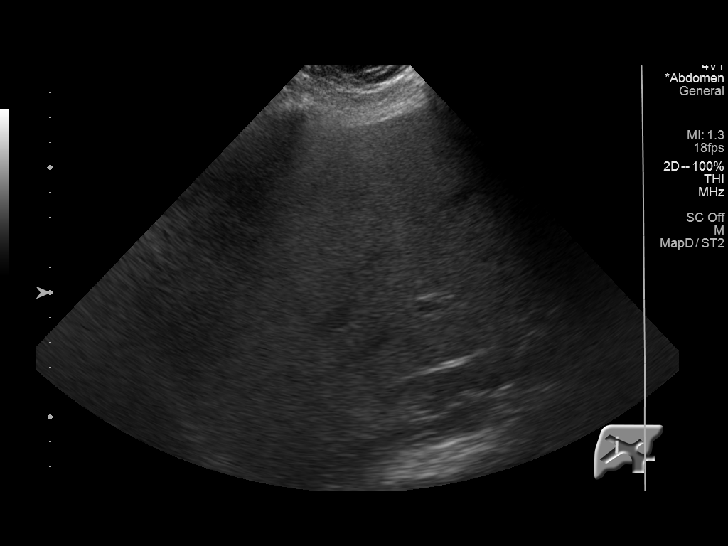
[im 57/86]
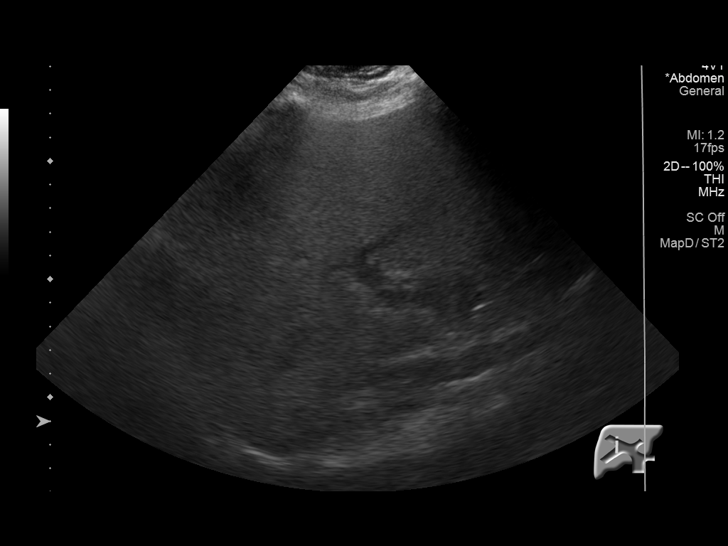
[im 64/86]
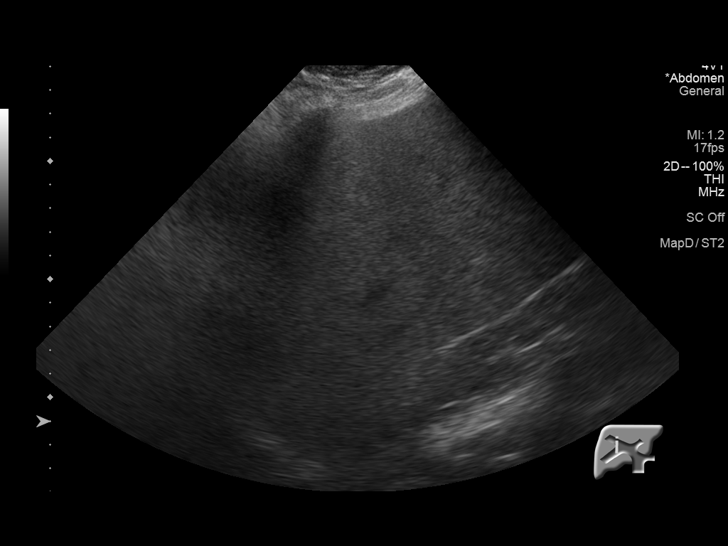
[im 71/86]
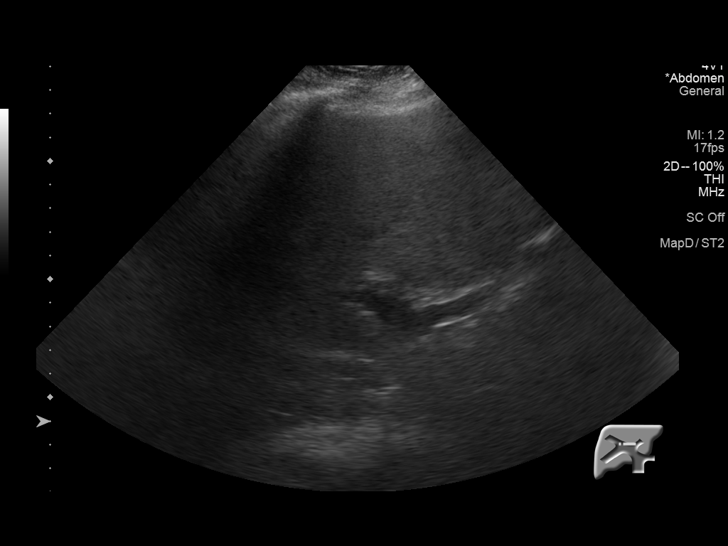
[im 78/86]
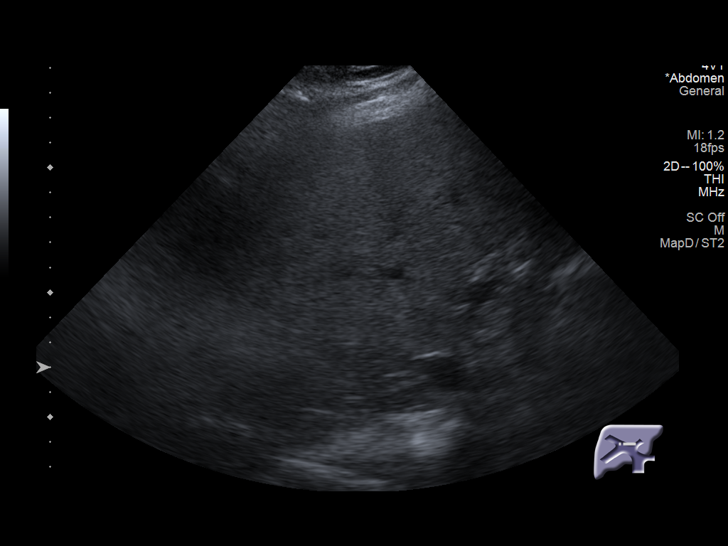
[im 86/86]
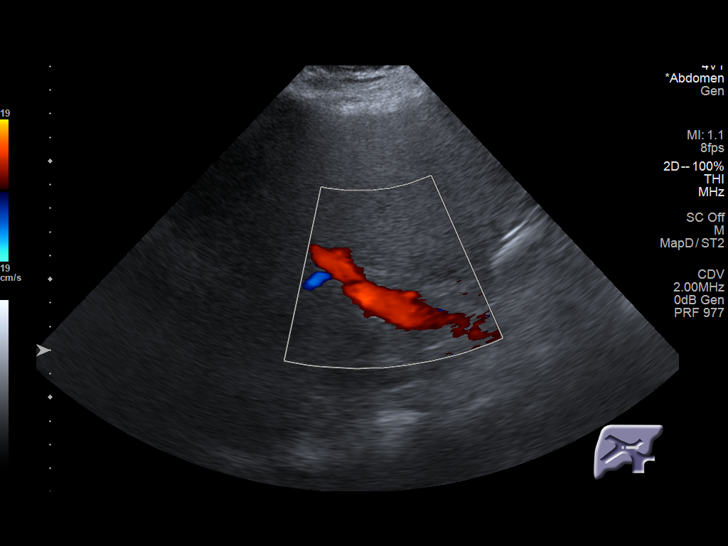

[14 of 25 positions shown; findings below may reference images not displayed]

FINDINGS: Gallbladder:

No gallstones or wall thickening visualized. No sonographic Murphy
sign noted by sonographer.

Common bile duct:

Diameter: 3 mm

Liver:

Liver parenchyma is diffusely echogenic with posterior acoustic
attenuation, compatible with diffuse hepatic steatosis. No definite
liver surface irregularity. Mild hepatomegaly. No liver mass
detected, noting decreased sensitivity in the setting of an
echogenic liver. Portal vein is patent on color Doppler imaging with
normal direction of blood flow towards the liver.

Other: None.
IMPRESSION: 1. Diffuse hepatic steatosis.
2. Otherwise normal right upper quadrant abdominal sonogram.

## 2021-10-15 ENCOUNTER — Other Ambulatory Visit (HOSPITAL_COMMUNITY): Payer: Self-pay | Admitting: Family Medicine

## 2021-10-15 DIAGNOSIS — R011 Cardiac murmur, unspecified: Secondary | ICD-10-CM

## 2021-10-26 ENCOUNTER — Ambulatory Visit (HOSPITAL_COMMUNITY)
Admission: RE | Admit: 2021-10-26 | Discharge: 2021-10-26 | Disposition: A | Discharge status: Home / Self Care | Payer: Medicare HMO | Source: Ambulatory Visit | Attending: Family Medicine | Admitting: Family Medicine

## 2021-10-26 DIAGNOSIS — R011 Cardiac murmur, unspecified: Secondary | ICD-10-CM | POA: Insufficient documentation

## 2021-10-26 LAB — ECHOCARDIOGRAM COMPLETE
Area-P 1/2: 2.37 cm2
S' Lateral: 3.2 cm

## 2021-10-26 NOTE — Progress Notes (Signed)
*  PRELIMINARY RESULTS* Echocardiogram 2D Echocardiogram has been performed.  Stacey Drain 10/26/2021, 9:05 AM

## 2022-03-15 ENCOUNTER — Other Ambulatory Visit (HOSPITAL_COMMUNITY): Payer: Self-pay | Admitting: Family Medicine

## 2022-03-15 DIAGNOSIS — I998 Other disorder of circulatory system: Secondary | ICD-10-CM

## 2022-03-31 ENCOUNTER — Ambulatory Visit (HOSPITAL_COMMUNITY)
Admission: RE | Admit: 2022-03-31 | Discharge: 2022-03-31 | Disposition: A | Discharge status: Home / Self Care | Payer: Medicare HMO | Source: Ambulatory Visit | Attending: Family Medicine | Admitting: Family Medicine

## 2022-03-31 DIAGNOSIS — I998 Other disorder of circulatory system: Secondary | ICD-10-CM
